# Patient Record
Sex: Male | Born: 1960 | Race: White | Hispanic: No | Marital: Married | Smoking: Former smoker
Health system: Southern US, Community
[De-identification: ages and names within clinical notes are randomized; demographics above are authoritative.]

## PROBLEM LIST (undated history)

## (undated) DIAGNOSIS — I10 Essential (primary) hypertension: Secondary | ICD-10-CM

## (undated) DIAGNOSIS — B159 Hepatitis A without hepatic coma: Secondary | ICD-10-CM

## (undated) DIAGNOSIS — K5792 Diverticulitis of intestine, part unspecified, without perforation or abscess without bleeding: Secondary | ICD-10-CM

## (undated) HISTORY — DX: Hepatitis a without hepatic coma: B15.9

## (undated) HISTORY — PX: TONSILLECTOMY: SUR1361

## (undated) HISTORY — DX: Diverticulitis of intestine, part unspecified, without perforation or abscess without bleeding: K57.92

---

## 1982-10-21 DIAGNOSIS — B159 Hepatitis A without hepatic coma: Secondary | ICD-10-CM

## 1982-10-21 HISTORY — DX: Hepatitis a without hepatic coma: B15.9

## 2006-02-12 ENCOUNTER — Emergency Department: Payer: Self-pay | Admitting: Emergency Medicine

## 2007-01-20 ENCOUNTER — Ambulatory Visit: Payer: Self-pay | Admitting: Nurse Practitioner

## 2011-10-22 DIAGNOSIS — K5792 Diverticulitis of intestine, part unspecified, without perforation or abscess without bleeding: Secondary | ICD-10-CM

## 2011-10-22 HISTORY — DX: Diverticulitis of intestine, part unspecified, without perforation or abscess without bleeding: K57.92

## 2013-04-20 HISTORY — PX: COLONOSCOPY: SHX174

## 2013-05-07 ENCOUNTER — Ambulatory Visit: Payer: Self-pay | Admitting: Gastroenterology

## 2013-08-28 ENCOUNTER — Encounter (HOSPITAL_COMMUNITY): Payer: Self-pay | Admitting: Emergency Medicine

## 2013-08-28 ENCOUNTER — Emergency Department (HOSPITAL_COMMUNITY)
Admission: EM | Admit: 2013-08-28 | Discharge: 2013-08-28 | Disposition: A | Discharge status: Home / Self Care | Payer: BC Managed Care – PPO | Attending: Emergency Medicine | Admitting: Emergency Medicine

## 2013-08-28 DIAGNOSIS — I10 Essential (primary) hypertension: Secondary | ICD-10-CM | POA: Insufficient documentation

## 2013-08-28 DIAGNOSIS — S61412A Laceration without foreign body of left hand, initial encounter: Secondary | ICD-10-CM

## 2013-08-28 DIAGNOSIS — Z87891 Personal history of nicotine dependence: Secondary | ICD-10-CM | POA: Insufficient documentation

## 2013-08-28 DIAGNOSIS — Y9389 Activity, other specified: Secondary | ICD-10-CM | POA: Insufficient documentation

## 2013-08-28 DIAGNOSIS — W298XXA Contact with other powered powered hand tools and household machinery, initial encounter: Secondary | ICD-10-CM | POA: Insufficient documentation

## 2013-08-28 DIAGNOSIS — Y929 Unspecified place or not applicable: Secondary | ICD-10-CM | POA: Insufficient documentation

## 2013-08-28 DIAGNOSIS — Z23 Encounter for immunization: Secondary | ICD-10-CM | POA: Insufficient documentation

## 2013-08-28 DIAGNOSIS — S61409A Unspecified open wound of unspecified hand, initial encounter: Secondary | ICD-10-CM | POA: Insufficient documentation

## 2013-08-28 HISTORY — DX: Essential (primary) hypertension: I10

## 2013-08-28 MED ORDER — TETANUS-DIPHTH-ACELL PERTUSSIS 5-2.5-18.5 LF-MCG/0.5 IM SUSP
0.5000 mL | Freq: Once | INTRAMUSCULAR | Status: AC
  Administered 2013-08-28: 0.5 mL via INTRAMUSCULAR
  Filled 2013-08-28: qty 0.5

## 2013-08-28 MED ORDER — LIDOCAINE HCL (PF) 1 % IJ SOLN
5.0000 mL | Freq: Once | INTRAMUSCULAR | Status: AC
  Administered 2013-08-28: 5 mL
  Filled 2013-08-28: qty 5

## 2013-08-28 MED ORDER — BACITRACIN-NEOMYCIN-POLYMYXIN 400-5-5000 EX OINT
TOPICAL_OINTMENT | Freq: Once | CUTANEOUS | Status: AC
  Administered 2013-08-28: 1 via TOPICAL
  Filled 2013-08-28: qty 1

## 2013-08-28 MED ORDER — CEPHALEXIN 500 MG PO CAPS
500.0000 mg | ORAL_CAPSULE | Freq: Once | ORAL | Status: AC
  Administered 2013-08-28: 500 mg via ORAL
  Filled 2013-08-28: qty 1

## 2013-08-28 MED ORDER — CEPHALEXIN 250 MG PO CAPS: 250.0000 mg | ORAL_CAPSULE | Freq: Four times a day (QID) | ORAL | Status: DC

## 2013-08-28 NOTE — ED Provider Notes (Signed)
CSN: [id number]     Arrival date & time 08/28/13  1646 History   First MD Initiated Contact with Patient 08/28/13 1732     Chief Complaint  Patient presents with  . Extremity Laceration   (Consider location/radiation/quality/duration/timing/severity/associated sxs/prior Treatment) Patient is a 52 y.o. male presenting with skin laceration. The history is provided by the patient.  Laceration Location:  Hand Hand laceration location:  L palm Depth:  Through dermis Quality: straight   Bleeding: controlled   Time since incident:  1 hour Pain details:    Quality:  Aching   Severity:  Moderate   Timing:  Constant   Progression:  Unchanged Foreign body present:  No foreign bodies Relieved by:  None tried Worsened by:  Movement Ineffective treatments:  None tried Tetanus status:  Unknown  Vincent Sanchez is a 52 y.o. male who presents to the ED with a laceration to the left hand. He was using a drill and it slipped and cut him.  Past Medical History  Diagnosis Date  . Hypertension    History reviewed. No pertinent past surgical history. No family history on file. History  Substance Use Topics  . Smoking status: Former Games developer  . Smokeless tobacco: Not on file  . Alcohol Use: No    Review of Systems Negative except as stated in HPI Allergies  Review of patient's allergies indicates no known allergies.  Home Medications  No current outpatient prescriptions on file. BP 137/61  Pulse 64  Temp(Src) 98.4 F (36.9 C) (Oral)  Resp 16  SpO2 100% Physical Exam  Nursing note and vitals reviewed. Constitutional: He is oriented to person, place, and time. He appears well-developed and well-nourished. No distress.  HENT:  Head: Normocephalic and atraumatic.  Eyes: Conjunctivae and EOM are normal.  Neck: Neck supple.  Pulmonary/Chest: Effort normal.  Abdominal: Soft. There is no tenderness.  Musculoskeletal:       Left hand: He exhibits laceration. He exhibits normal range of  motion, no bony tenderness, no deformity and no swelling. Normal sensation noted. Normal strength noted.       Hands: Neurological: He is alert and oriented to person, place, and time. No cranial nerve deficit.  Skin: Skin is warm and dry.  Psychiatric: He has a normal mood and affect. His behavior is normal.    ED Course  Procedures  LACERATION REPAIR Performed by: Trystin Hargrove Authorized by: Daryn Pisani Consent: Verbal consent obtained. Risks and benefits: risks, benefits and alternatives were discussed Consent given by: patient Patient identity confirmed: provided demographic data Prepped and Draped in normal sterile fashion Wound explored  Laceration Location: left hand  Laceration Length: 2 cm  No Foreign Bodies seen or palpated  Cleaned with betadine  Anesthesia: local infiltration  Local anesthetic: lidocaine 1% without epinephrine  Anesthetic total: 1 ml  Irrigation method: syringe Amount of cleaning: standard  Skin closure: 5-0 prolene  Number of sutures: 2  Technique: interrupted  Patient tolerance: Patient tolerated the procedure well with no immediate complications.  MDM  52 y.o. male with laceration to the right hand while using a drill. Neurovascularly intact and stable for discharge without any immediate complications. Tetanus updated. Bacitracin ointment and dressing applied. Follow up with PCP or here for suture removal in 7 days. He will return sooner for any problems.    Medication List         cephALEXin 250 MG capsule  Commonly known as:  KEFLEX  Take 1 capsule (250 mg total) by mouth 4 (four)  times daily.           [facility] Orlene Och, [place] 08/29/13 [phone]

## 2013-08-28 NOTE — ED Notes (Signed)
Laceration to base of left thumb, injured by a drill bit. Bleeding is controlled. Occurred 1 hour PTA. Unknown tetanus

## 2013-09-01 NOTE — ED Provider Notes (Signed)
Medical screening examination/treatment/procedure(s) were performed by non-physician practitioner and as supervising physician I was immediately available for consultation/collaboration.  EKG Interpretation   None         Shelda Jakes, MD 09/01/13 [phone]

## 2014-07-26 ENCOUNTER — Ambulatory Visit (INDEPENDENT_AMBULATORY_CARE_PROVIDER_SITE_OTHER): Payer: BC Managed Care – PPO | Admitting: General Surgery

## 2014-07-26 ENCOUNTER — Encounter: Payer: Self-pay | Admitting: General Surgery

## 2014-07-26 VITALS — BP 144/80 | HR 58 | Resp 12 | Ht 74.5 in | Wt 305.0 lb

## 2014-07-26 DIAGNOSIS — K8010 Calculus of gallbladder with chronic cholecystitis without obstruction: Secondary | ICD-10-CM

## 2014-07-26 NOTE — Progress Notes (Addendum)
Patient ID: Vincent Sanchez, male   DOB: Apr 30, 1961, 53 y.o.   MRN: [id number]  Chief Complaint  Patient presents with  . Abdominal Pain    gall bladder    HPI Vincent Sanchez is a 53 y.o. male.  Here today for evaluation of his gallbladder. Abdominal ultrasound completed 07-08-14. The pain is under his right rib area off and on for about a month. He states no pain this week but prior to that the pain was daily lasting "all day" but some days only 15 minutes. On Saturday one week ago the pain was all day. He does not think it is associated with any foods. No nausea or vomiting. Bowels ar normal and daily. He will have a sudden onset of a itching rash/whelps, abdomen, legs and arms. It comes and goes but not in relationship to the pain, it just occurs at random.   He has lost 100 pounds over a 2 year time time frame by modifying his diet and beginning an exercise program.  The patient is accompanied today by his wife who was present for the interview and exam.  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   HPI  Past Medical History  Diagnosis Date  . Hypertension   . Hepatitis A 1984  . Diverticulitis 2013    Past Surgical History  Procedure Laterality Date  . Tonsillectomy      age 74  . Colonoscopy  July 2014    Dr Servando Snare    Family History  Problem Relation Age of Onset  . Diabetes Father     Social History History  Substance Use Topics  . Smoking status: Former Smoker -- 14 years    Quit date: 10/21/1986  . Smokeless tobacco: Never Used  . Alcohol Use: No    No Known Allergies  Current Outpatient Prescriptions  Medication Sig Dispense Refill  . benazepril-hydrochlorthiazide (LOTENSIN HCT) 20-12.5 MG per tablet Take 1 tablet by mouth daily.       . furosemide (LASIX) 40 MG tablet Take by mouth daily.        No current facility-administered medications for this visit.    Review of Systems Review of Systems  Constitutional: Negative.   Cardiovascular: Negative.    Gastrointestinal: Positive for constipation. Negative for nausea, vomiting, diarrhea and blood in stool.    Blood pressure 144/80, pulse 58, resp. rate 12, height 6' 2.5" (1.892 m), weight 305 lb (138.347 kg).  Physical Exam Physical Exam  Constitutional: He is oriented to person, place, and time. He appears well-developed and well-nourished.  Neck: Neck supple.  Cardiovascular: Normal rate, regular rhythm and normal heart sounds.   Pulses:      Dorsalis pedis pulses are 2+ on the right side, and 2+ on the left side.  Pulmonary/Chest: Effort normal and breath sounds normal.  Abdominal: Soft. Normal appearance and bowel sounds are normal. There is tenderness in the right upper quadrant.  Lymphadenopathy:    He has no cervical adenopathy.  Neurological: He is alert and oriented to person, place, and time.  Skin: Skin is warm and dry.    Data Reviewed Abdominal ultrasound completed at [facility] in [place] showed cholelithiasis. Common bile duct 5 mm. No gallbladder wall thickening.  Assessment    Symptomatic cholelithiasis.    Plan    Indications for cholecystectomy were reviewed. 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    Laparoscopic Cholecystectomy with Intraoperative Cholangiogram. The procedure, including  it's potential risks and complications (including but not limited to infection, bleeding, injury to intra-abdominal organs or bile ducts, bile leak, poor cosmetic result, sepsis and death) were discussed with the patient in detail. Non-operative options, including their inherent risks (acute calculous cholecystitis with possible choledocholithiasis or gallstone pancreatitis, with the risk of ascending cholangitis, sepsis, and death) were discussed as well. The patient expressed and understanding of what we discussed and wishes to proceed with laparoscopic cholecystectomy. The patient further understands that if it is technically  not possible, or it is unsafe to proceed laparoscopically, that I will convert to an open cholecystectomy.  Patient is scheduled for [facility] at [facility] on 08/08/14. He will pre admit by phone. He is aware of date and instructions.  A prescription for Percocet 5/325, #30 with the inscription 1-2 by mouth every 4 hours when necessary for pain with no refills was provided.   PCP/Ref: Clovis PuVan Horn, Mancel ParsonsJill E   Nevan Creighton W 07/27/2014, 8:33 PM

## 2014-07-26 NOTE — Patient Instructions (Addendum)

## 2014-07-27 ENCOUNTER — Other Ambulatory Visit: Payer: Self-pay | Admitting: General Surgery

## 2014-07-27 DIAGNOSIS — K8010 Calculus of gallbladder with chronic cholecystitis without obstruction: Secondary | ICD-10-CM | POA: Insufficient documentation

## 2014-08-08 ENCOUNTER — Ambulatory Visit: Payer: Self-pay | Admitting: General Surgery

## 2014-08-08 ENCOUNTER — Encounter: Payer: Self-pay | Admitting: General Surgery

## 2014-08-08 DIAGNOSIS — K8010 Calculus of gallbladder with chronic cholecystitis without obstruction: Secondary | ICD-10-CM

## 2014-08-08 HISTORY — PX: CHOLECYSTECTOMY: SHX55

## 2014-08-10 ENCOUNTER — Encounter: Payer: Self-pay | Admitting: General Surgery

## 2014-08-10 LAB — PATHOLOGY REPORT

## 2014-08-17 ENCOUNTER — Ambulatory Visit: Payer: BC Managed Care – PPO | Admitting: General Surgery

## 2014-08-22 ENCOUNTER — Ambulatory Visit (INDEPENDENT_AMBULATORY_CARE_PROVIDER_SITE_OTHER): Payer: Self-pay | Admitting: General Surgery

## 2014-08-22 ENCOUNTER — Encounter: Payer: Self-pay | Admitting: General Surgery

## 2014-08-22 VITALS — BP 130/78 | HR 76 | Resp 12 | Ht 75.0 in | Wt 309.0 lb

## 2014-08-22 DIAGNOSIS — K8010 Calculus of gallbladder with chronic cholecystitis without obstruction: Secondary | ICD-10-CM

## 2014-08-22 NOTE — Patient Instructions (Signed)
Patient to return to work on 08/23/14. Return as needed.

## 2014-08-22 NOTE — Progress Notes (Signed)
Patient ID: Vincent Sanchez, male   DOB: [birth year], 53 y.o.   MRN: [id number]  Chief Complaint  Patient presents with  . Routine Post Op    gallbladder    HPI Vincent Sanchez is a 53 y.o. male here today for his post op gallbladder surgery done on 08/08/2014. Patient states he is doing well.Patient states he had a rash but it is gone now.  HPI  Past Medical History  Diagnosis Date  . Hypertension   . Hepatitis A 1984  . Diverticulitis 2013    Past Surgical History  Procedure Laterality Date  . Tonsillectomy      age 53  . Colonoscopy  July 2014    Dr Servando SnareWohl  . Cholecystectomy  08/08/14    Family History  Problem Relation Age of Onset  . Diabetes Father     Social History History  Substance Use Topics  . Smoking status: Former Smoker -- 14 years    Quit date: 10/21/1986  . Smokeless tobacco: Never Used  . Alcohol Use: No    No Known Allergies  Current Outpatient Prescriptions  Medication Sig Dispense Refill  . benazepril-hydrochlorthiazide (LOTENSIN HCT) 20-12.5 MG per tablet Take 1 tablet by mouth daily.     . furosemide (LASIX) 40 MG tablet Take by mouth daily.      No current facility-administered medications for this visit.    Review of Systems Review of Systems  Constitutional: Negative.   Respiratory: Negative.   Cardiovascular: Negative.     Blood pressure 130/78, pulse 76, resp. rate 12, height 6\' 3"  (1.905 m), weight 309 lb (140.161 kg).  Physical Exam Physical Exam  Constitutional: He is oriented to person, place, and time. He appears well-developed and well-nourished.  Abdominal:  Port site are clean and healing well  Neurological: He is alert and oriented to person, place, and time.  Skin: Skin is warm and dry.    Data Reviewed Chronic cholecystitis/ cholelithiasis. No malignancy.   Assessment    Doing well post cholecystectomy.     Plan    Patient to return as needed.  Return to work today.     PCP/Ref: Clovis PuVan Horn, Mancel ParsonsJill  E   Byrnett, Jeffrey W 08/23/2014, 9:11 PM

## 2015-02-11 NOTE — Op Note (Signed)
PATIENT NAME:  Vincent Sanchez, Eri L MR#:  [id number] DATE OF BIRTH:  08-28-1961  DATE OF PROCEDURE:  08/08/2014  PREOPERATIVE DIAGNOSIS: Chronic cholecystitis and cholelithiasis.   POSTOPERATIVE DIAGNOSIS: Chronic cholecystitis and cholelithiasis.   OPERATIVE PROCEDURE: Laparoscopic cholecystectomy with intraoperative cholangiograms.   SURGEON: Earline MayotteJeffrey W. Radhika Dershem, MD  ANESTHESIA: General endotracheal under Naomie DeanWilliam K. Kephart, MD   ESTIMATED BLOOD LOSS: Less than 5 mL.   CLINICAL NOTE: This [age] year old male has lost nearly 100 pounds with diet and exercise and has subsequently developed cholelithiasis. He is admitted for elective cholecystectomy.   OPERATIVE NOTE: With the patient under adequate general endotracheal anesthesia, the abdomen was prepped with ChloraPrep and draped. In Trendelenburg position, a Veress needle was placed through a transumbilical incision. After assuring intra-abdominal location with the hanging drop test, the abdomen was insufflated with CO2 at 10 mmHg pressure. A 10 mm step port was expanded and inspection showed no evidence of injury from initial port placement. An 11 mm XL port was placed in the epigastrium after the patient was placed in reverse Trendelenburg position and rolled to the left. Two 5 mm step ports were placed in the right lateral abdominal wall. The gallbladder showed evidence of mild chronic inflammation. There were adhesions between the omentum and the falciform ligament as well as the neck of the gallbladder. These were taken down with cautery dissection. With the gallbladder in cephalad traction, the cystic duct was clear.  Fluoroscopic cholangiograms were completed using 37 mL of one-half strength Conray 60. There was prompt filling of the right and left hepatic ducts and free flow into a nondistended distal common bile duct and into the duodenum. What appeared to be multiple small air bubbles were identified and these eventually cleared during  fluoroscopic examination. There was some reflux into the pancreatic duct. The cystic duct and branches of the cystic artery were doubly clipped and divided. The gallbladder was then removed from the liver bed making use of hook cautery dissection. It was delivered through the umbilical port site. Inspection from the epigastric site showed no evidence of injury from initial port placement. The right upper quadrant was irrigated with lactated Ringer's solution. Excellent hemostasis was noted. The abdomen was then desufflated and ports removed under direct vision. The fascia at the umbilicus was closed with an 0 Vicryl figure-of-eight suture. Skin incisions were closed with 4-0 Vicryl subcuticular sutures. Benzoin, Steri-Strips, Telfa and Tegaderm dressings were then applied. The patient tolerated the procedure well and was taken to the recovery room in stable condition.    ____________________________ Earline MayotteJeffrey W. Madelina Sanda, MD jwb:LT D: 08/08/2014 14:29:00 ET T: 08/08/2014 16:37:07 ET JOB#: [id number]  cc: Earline MayotteJeffrey W. Fredda Clarida, MD, <Dictator> [facility] Bucio Brion AlimentW Latravis Grine MD ELECTRONICALLY SIGNED 08/10/2014 21:32

## 2017-04-17 ENCOUNTER — Inpatient Hospital Stay: Payer: BC Managed Care – PPO | Attending: Oncology | Admitting: Oncology

## 2017-04-17 ENCOUNTER — Inpatient Hospital Stay: Payer: BC Managed Care – PPO

## 2017-04-17 ENCOUNTER — Encounter (INDEPENDENT_AMBULATORY_CARE_PROVIDER_SITE_OTHER): Payer: Self-pay

## 2017-04-17 ENCOUNTER — Encounter: Payer: Self-pay | Admitting: Oncology

## 2017-04-17 VITALS — BP 124/83 | HR 74 | Temp 98.0°F | Resp 18 | Ht 74.0 in | Wt 341.5 lb

## 2017-04-17 DIAGNOSIS — Z79899 Other long term (current) drug therapy: Secondary | ICD-10-CM | POA: Diagnosis not present

## 2017-04-17 DIAGNOSIS — E785 Hyperlipidemia, unspecified: Secondary | ICD-10-CM | POA: Diagnosis not present

## 2017-04-17 DIAGNOSIS — Z87891 Personal history of nicotine dependence: Secondary | ICD-10-CM | POA: Insufficient documentation

## 2017-04-17 DIAGNOSIS — K8010 Calculus of gallbladder with chronic cholecystitis without obstruction: Secondary | ICD-10-CM | POA: Diagnosis not present

## 2017-04-17 DIAGNOSIS — I10 Essential (primary) hypertension: Secondary | ICD-10-CM | POA: Insufficient documentation

## 2017-04-17 DIAGNOSIS — E119 Type 2 diabetes mellitus without complications: Secondary | ICD-10-CM | POA: Diagnosis not present

## 2017-04-17 DIAGNOSIS — M1990 Unspecified osteoarthritis, unspecified site: Secondary | ICD-10-CM | POA: Diagnosis not present

## 2017-04-17 DIAGNOSIS — D72829 Elevated white blood cell count, unspecified: Secondary | ICD-10-CM

## 2017-04-17 DIAGNOSIS — D72821 Monocytosis (symptomatic): Secondary | ICD-10-CM | POA: Insufficient documentation

## 2017-04-17 LAB — CBC WITH DIFFERENTIAL/PLATELET
Basophils Absolute: 0.1 10*3/uL (ref 0–0.1)
Basophils Relative: 1 %
Eosinophils Absolute: 0.2 10*3/uL (ref 0–0.7)
Eosinophils Relative: 1 %
HCT: 45.6 % (ref 40.0–52.0)
HEMOGLOBIN: 15.9 g/dL (ref 13.0–18.0)
LYMPHS ABS: 2.3 10*3/uL (ref 1.0–3.6)
Lymphocytes Relative: 18 %
MCH: 30.2 pg (ref 26.0–34.0)
MCHC: 34.9 g/dL (ref 32.0–36.0)
MCV: 86.7 fL (ref 80.0–100.0)
Monocytes Absolute: 1.1 10*3/uL — ABNORMAL HIGH (ref 0.2–1.0)
Monocytes Relative: 8 %
NEUTROS PCT: 72 %
Neutro Abs: 9.2 10*3/uL — ABNORMAL HIGH (ref 1.4–6.5)
Platelets: 358 10*3/uL (ref 150–440)
RBC: 5.26 MIL/uL (ref 4.40–5.90)
RDW: 14 % (ref 11.5–14.5)
WBC: 12.9 10*3/uL — AB (ref 3.8–10.6)

## 2017-04-17 LAB — SEDIMENTATION RATE: Sed Rate: 7 mm/hr (ref 0–20)

## 2017-04-17 LAB — PATHOLOGIST SMEAR REVIEW

## 2017-04-17 NOTE — Progress Notes (Signed)
Hematology/Oncology Consult note [facility] [phone] [phone]  Patient Care Team: Renee Rival, NP as PCP - General (Nurse Practitioner) Robert Bellow, MD (General Surgery) Velta Addison, Claretha Cooper, DO (Osteopathic Medicine)   Name of the patient: Vincent Sanchez  [id number]  11/06/1960    Reason for referral- leucocytosis   Referring physician- Angelina Ok  Date of visit: 04/17/17   History of presenting illness- patient is a 56 year old male with a past medical history significant for hypertension, diet-controlled diabetes, hyperlipidemia and arthritis. He has been referred to a leukocytosis. Recent CBC from 01/30/2017 showed white count of 12.3, H&H of 13/45.1 and platelet count of 335. Differential mainly showed neutrophilia with an absolute neutrophil count of 9.0 and monocytosis with an absolute monocyte count of 1.1. Prior CBC from 08/02/2016 showed white count of 12.5-10 with predominantly neutrophilia and WBC from 01/30/2016 showed white count of 10.3. Patient reports mild fatigue. Denies any unintentional weight loss or loss of appetite. Denies any drenching night sweats or lumps or bumps anywhere. He does have itching mainly in his bilateral forearms and his abdomen especially during fall season from allergies which is relatively well controlled with Zyrtec. Also reports pain in his bilateral ankles and stiffness in his small joints of the hands for which he uses glucosamine  ECOG PS- 1  Pain scale- 0   Review of systems- Review of Systems  Constitutional: Negative for chills, fever, malaise/fatigue and weight loss.  HENT: Negative for congestion, ear discharge and nosebleeds.   Eyes: Negative for blurred vision.  Respiratory: Negative for cough, hemoptysis, sputum production, shortness of breath and wheezing.   Cardiovascular: Negative for chest pain, palpitations, orthopnea and claudication.  Gastrointestinal:  Negative for abdominal pain, blood in stool, constipation, diarrhea, heartburn, melena, nausea and vomiting.  Genitourinary: Negative for dysuria, flank pain, frequency, hematuria and urgency.  Musculoskeletal: Positive for joint pain. Negative for back pain and myalgias.  Skin: Positive for itching. Negative for rash.  Neurological: Negative for dizziness, tingling, focal weakness, seizures, weakness and headaches.  Endo/Heme/Allergies: Does not bruise/bleed easily.  Psychiatric/Behavioral: Negative for depression and suicidal ideas. The patient does not have insomnia.     No Known Allergies  Patient Active Problem List   Diagnosis Date Noted  . Gallbladder stone with nonacute cholecystitis 07/27/2014     Past Medical History:  Diagnosis Date  . Diverticulitis 2013  . Hepatitis A 1984  . Hypertension      Past Surgical History:  Procedure Laterality Date  . CHOLECYSTECTOMY  08/08/14  . COLONOSCOPY  July 2014   Dr Allen Norris  . TONSILLECTOMY     age 84    Social History   Social History  . Marital status: Married    Spouse name: N/A  . Number of children: N/A  . Years of education: N/A   Occupational History  . Not on file.   Social History Main Topics  . Smoking status: Former Smoker    Years: 14.00    Quit date: 10/21/1986  . Smokeless tobacco: Never Used  . Alcohol use No  . Drug use: No  . Sexual activity: Not on file   Other Topics Concern  . Not on file   Social History Narrative  . No narrative on file     Family History  Problem Relation Age of Onset  . Diabetes Father   . Cancer Mother   . Cancer Maternal Grandfather      Current Outpatient Prescriptions:  .  benazepril-hydrochlorthiazide (LOTENSIN HCT) 20-12.5 MG per tablet, Take 1 tablet by mouth daily. , Disp: , Rfl:  .  furosemide (LASIX) 40 MG tablet, Take by mouth daily. , Disp: , Rfl:  .  Glucosamine-Chondroit-Vit C-Mn (GLUCOSAMINE 1500 COMPLEX) CAPS, Take 2 capsules by mouth daily.,  Disp: , Rfl:  .  loratadine (CLARITIN) 10 MG tablet, Take 10 mg by mouth daily., Disp: , Rfl:    Physical exam:  Vitals:   04/17/17 1338  BP: 124/83  Pulse: 74  Resp: 18  Temp: 98 F (36.7 C)  TempSrc: Tympanic  Weight: (!) 341 lb 8 oz (154.9 kg)  Height: _0  (1.88 m)   Physical Exam  Constitutional: He is oriented to person, place, and time.  Obese, in no acute distress  HENT:  Head: Normocephalic and atraumatic.  Eyes: EOM are normal. Pupils are equal, round, and reactive to light.  Neck: Normal range of motion.  Cardiovascular: Normal rate, regular rhythm and normal heart sounds.   Pulmonary/Chest: Effort normal and breath sounds normal.  Abdominal: Soft. Bowel sounds are normal.  Lymphadenopathy:  No palpable cervical, supraclavicular, axillary or inguinal adenopathy  Neurological: He is alert and oriented to person, place, and time.  Skin: Skin is warm and dry.        Assessment and plan- Patient is a 56 y.o. male referred for mild leucocytosis likely reactive  Leucocytosis mainly neutrophilia and monocytosis. It is mild wbc around 12 since October 2017. Not trending up. No other associated cytopenias, b symptoms or adenopathy. This is likely reactive due to allergies/ arthritis. Check cbc with diff, pathology review of smear, bcr abl testing and peripheral flow cytometry. I will call patient if results are abnormal. Otherwise I will see him back in 6 months with cbc/diff. No indication for bone marrow biopsy at this point  Thank you for this kind referral and the opportunity to participate in the care of this patient   Visit Diagnosis 1. Leukocytosis, unspecified type     Dr. Randa Evens, MD, MPH [facility] Pager- [phone] 04/17/2017  2:08 PM

## 2017-04-17 NOTE — Progress Notes (Signed)
Patient here today as new evaluation regarding leukocytosis.  Referred by Dr. Iran OuchStrader.

## 2017-04-21 LAB — COMP PANEL: LEUKEMIA/LYMPHOMA

## 2017-04-28 LAB — BCR-ABL1 FISH
CELLS ANALYZED: 200
Cells Counted: 200
PDF: 0

## 2017-10-23 ENCOUNTER — Telehealth: Payer: Self-pay | Admitting: Oncology

## 2017-10-23 ENCOUNTER — Inpatient Hospital Stay: Payer: BC Managed Care – PPO

## 2017-10-23 ENCOUNTER — Inpatient Hospital Stay: Payer: BC Managed Care – PPO | Admitting: Oncology

## 2017-10-23 NOTE — Telephone Encounter (Signed)
Called patient to reschedule missed appointments scheduled for 10/23/17. Spoke with patient regarding rescheduling missed appt and he stated that he does not wish to [facility] at [facility].  DO NOT RESCHEDULE, per patient request.  Dr Smith Robertao & Cordelia PenSherry notified via Staff msg.  notified.MF

## 2018-01-16 ENCOUNTER — Other Ambulatory Visit: Payer: Self-pay | Admitting: Internal Medicine

## 2018-01-16 DIAGNOSIS — I2089 Other forms of angina pectoris: Secondary | ICD-10-CM

## 2018-01-16 DIAGNOSIS — I208 Other forms of angina pectoris: Secondary | ICD-10-CM

## 2018-01-16 DIAGNOSIS — R0602 Shortness of breath: Secondary | ICD-10-CM

## 2018-01-16 DIAGNOSIS — I5030 Unspecified diastolic (congestive) heart failure: Secondary | ICD-10-CM

## 2018-01-22 ENCOUNTER — Ambulatory Visit
Admission: RE | Admit: 2018-01-22 | Discharge: 2018-01-22 | Disposition: A | Discharge status: Home / Self Care | Payer: BC Managed Care – PPO | Source: Ambulatory Visit | Attending: Internal Medicine | Admitting: Internal Medicine

## 2018-01-22 DIAGNOSIS — R0602 Shortness of breath: Secondary | ICD-10-CM

## 2018-01-22 DIAGNOSIS — I5030 Unspecified diastolic (congestive) heart failure: Secondary | ICD-10-CM | POA: Diagnosis not present

## 2018-01-22 DIAGNOSIS — I208 Other forms of angina pectoris: Secondary | ICD-10-CM | POA: Insufficient documentation

## 2018-01-22 DIAGNOSIS — I110 Hypertensive heart disease with heart failure: Secondary | ICD-10-CM | POA: Insufficient documentation

## 2018-01-22 DIAGNOSIS — I351 Nonrheumatic aortic (valve) insufficiency: Secondary | ICD-10-CM | POA: Diagnosis not present

## 2018-01-22 NOTE — Progress Notes (Signed)
*  PRELIMINARY RESULTS* Echocardiogram 2D Echocardiogram has been performed.  Vincent GulaJoan M Merari Sanchez 01/22/2018, 11:47 AM

## 2018-01-23 ENCOUNTER — Ambulatory Visit
Admission: RE | Admit: 2018-01-23 | Discharge: 2018-01-23 | Disposition: A | Discharge status: Home / Self Care | Payer: BC Managed Care – PPO | Source: Ambulatory Visit | Attending: Internal Medicine | Admitting: Internal Medicine

## 2018-01-23 MED ORDER — TECHNETIUM TC 99M TETROFOSMIN IV KIT
30.6500 | PACK | Freq: Once | INTRAVENOUS | Status: AC | PRN
  Administered 2018-01-23: 30.65 via INTRAVENOUS

## 2018-03-04 LAB — NM MYOCAR MULTI W/SPECT W/WALL MOTION / EF
CHL CUP NUCLEAR SRS: 2
CHL CUP RESTING HR STRESS: 99 {beats}/min
Estimated workload: 1 METS
Exercise duration (min): 1 min
Exercise duration (sec): 0 s
LV dias vol: 162 mL (ref 62–150)
LVSYSVOL: 102 mL
MPHR: 163 {beats}/min
NUC STRESS TID: 1.06
Peak HR: 148 {beats}/min
Percent HR: 90 %
SDS: 1
SSS: 1

## 2018-10-09 ENCOUNTER — Encounter: Payer: Self-pay | Admitting: Anesthesiology

## 2018-10-12 ENCOUNTER — Ambulatory Visit: Payer: BC Managed Care – PPO | Admitting: Anesthesiology

## 2018-10-12 ENCOUNTER — Encounter: Payer: Self-pay | Admitting: *Deleted

## 2018-10-12 ENCOUNTER — Ambulatory Visit
Admission: RE | Admit: 2018-10-12 | Discharge: 2018-10-12 | Disposition: A | Discharge status: Home / Self Care | Payer: BC Managed Care – PPO | Attending: Internal Medicine | Admitting: Internal Medicine

## 2018-10-12 ENCOUNTER — Other Ambulatory Visit: Payer: Self-pay

## 2018-10-12 ENCOUNTER — Encounter
Admission: RE | Disposition: A | Discharge status: Home / Self Care | Payer: Self-pay | Source: Home / Self Care | Attending: Internal Medicine

## 2018-10-12 DIAGNOSIS — Z6841 Body Mass Index (BMI) 40.0 and over, adult: Secondary | ICD-10-CM | POA: Insufficient documentation

## 2018-10-12 DIAGNOSIS — M1990 Unspecified osteoarthritis, unspecified site: Secondary | ICD-10-CM | POA: Insufficient documentation

## 2018-10-12 DIAGNOSIS — Z87891 Personal history of nicotine dependence: Secondary | ICD-10-CM | POA: Diagnosis not present

## 2018-10-12 DIAGNOSIS — I10 Essential (primary) hypertension: Secondary | ICD-10-CM | POA: Insufficient documentation

## 2018-10-12 DIAGNOSIS — Z79899 Other long term (current) drug therapy: Secondary | ICD-10-CM | POA: Insufficient documentation

## 2018-10-12 DIAGNOSIS — I4891 Unspecified atrial fibrillation: Secondary | ICD-10-CM | POA: Diagnosis present

## 2018-10-12 HISTORY — PX: CARDIOVERSION: EP1203

## 2018-10-12 SURGERY — CARDIOVERSION (CATH LAB)
Anesthesia: General

## 2018-10-12 MED ORDER — HYDROCORTISONE 1 % EX CREA
1.0000 "application " | TOPICAL_CREAM | Freq: Three times a day (TID) | CUTANEOUS | Status: DC | PRN
  Filled 2018-10-12: qty 28

## 2018-10-12 MED ORDER — PROPOFOL 10 MG/ML IV BOLUS
INTRAVENOUS | Status: DC | PRN
  Administered 2018-10-12 (×3): 20 mg via INTRAVENOUS
  Administered 2018-10-12: 50 mg via INTRAVENOUS

## 2018-10-12 MED ORDER — SODIUM CHLORIDE 0.9 % IV SOLN
INTRAVENOUS | Status: DC
  Administered 2018-10-12 (×2): via INTRAVENOUS

## 2018-10-12 MED ORDER — MIDAZOLAM HCL 2 MG/2ML IJ SOLN
INTRAMUSCULAR | Status: DC | PRN
  Administered 2018-10-12: 1 mg via INTRAVENOUS

## 2018-10-12 MED ORDER — MIDAZOLAM HCL 2 MG/2ML IJ SOLN
INTRAMUSCULAR | Status: AC
  Filled 2018-10-12: qty 2

## 2018-10-12 MED ORDER — LIDOCAINE HCL (CARDIAC) PF 100 MG/5ML IV SOSY
PREFILLED_SYRINGE | INTRAVENOUS | Status: DC | PRN
  Administered 2018-10-12: 80 mg via INTRAVENOUS

## 2018-10-12 MED ORDER — PROPOFOL 10 MG/ML IV BOLUS
INTRAVENOUS | Status: AC
  Filled 2018-10-12: qty 20

## 2018-10-12 NOTE — Anesthesia Preprocedure Evaluation (Addendum)
Anesthesia Evaluation  Patient identified by MRN, date of birth, ID band Patient awake    Reviewed: Allergy & Precautions, NPO status , Patient's Chart, lab work & pertinent test results, reviewed documented beta blocker date and time   Airway Mallampati: III  TM Distance: >3 FB     Dental  (+) Chipped   Pulmonary former smoker,           Cardiovascular hypertension,      Neuro/Psych    GI/Hepatic (+) Hepatitis -  Endo/Other  Morbid obesity  Renal/GU      Musculoskeletal  (+) Arthritis ,   Abdominal   Peds  Hematology   Anesthesia Other Findings EF 50-55 on 4/19.  Reproductive/Obstetrics                            Anesthesia Physical Anesthesia Plan  ASA: III  Anesthesia Plan: General   Post-op Pain Management:    Induction: Intravenous  PONV Risk Score and Plan:   Airway Management Planned:   Additional Equipment:   Intra-op Plan:   Post-operative Plan:   Informed Consent: I have reviewed the patients History and Physical, chart, labs and discussed the procedure including the risks, benefits and alternatives for the proposed anesthesia with the patient or authorized representative who has indicated his/her understanding and acceptance.     Plan Discussed with: CRNA  Anesthesia Plan Comments:         Anesthesia Quick Evaluation

## 2018-10-12 NOTE — Transfer of Care (Signed)
Immediate Anesthesia Transfer of Care Note  Patient: Vincent ShiversMichael Lawrence Kurtz  Procedure(s) Performed: CARDIOVERSION (N/A )  Patient Location: PACU  Anesthesia Type:General  Level of Consciousness: sedated  Airway & Oxygen Therapy: Patient Spontanous Breathing and Patient connected to nasal cannula oxygen  Post-op Assessment: Report given to RN and Post -op Vital signs reviewed and stable  Post vital signs: Reviewed and stable  Last Vitals:  Vitals Value Taken Time  BP 116/72 10/12/2018  7:43 AM  Temp    Pulse 69 10/12/2018  7:52 AM  Resp 17 10/12/2018  7:52 AM  SpO2 97 % 10/12/2018  7:52 AM    Last Pain:  Vitals:   10/12/18 0653  TempSrc: Oral  PainSc: 0-No pain         Complications: No apparent anesthesia complications

## 2018-10-12 NOTE — Discharge Instructions (Signed)
Chemical Cardioversion, Care After This sheet gives you information about how to care for yourself after your procedure. Your health care provider may also give you more specific instructions. If you have problems or questions, contact your health care provider. What can I expect after the procedure? After the procedure, it is common to have:  Fatigue or tiredness.  . Follow these instructions at home: General instructions   Take over-the-counter and prescription medicines only as told by your health care provider. You may need to take blood thinners (anticoagulants) or medicines to control your heart rhythm.  If you are taking blood thinners: ? Talk with your health care provider before you take any medicines that contain aspirin or NSAIDs. These medicines increase your risk for dangerous bleeding. ? Take your medicine exactly as told, at the same time every day. ? Avoid activities that could cause injury or bruising, and follow instructions about how to prevent falls. ? Wear a medical alert bracelet or carry a card that lists what medicines you take.  Keep all follow-up visits as told by your health care provider. This is important. Eating and drinking   Follow instructions from your health care provider about eating and drinking restrictions. You may have to follow a low-salt (low-sodium), low-fat, and low-cholesterol diet.  Drink enough fluid to keep your urine pale yellow. Activity  Ask your health care provider what activities are safe for you.  Do not drive for 24 hours if you were given a medicine to help you relax (sedative) during your procedure. Lifestyle  Limit alcohol intake to no more than 1 drink a day for nonpregnant women and 2 drinks a day for men. One drink equals 12 oz of beer, 5 oz of wine, or 1 oz of hard liquor.  Do not use any products that contain nicotine or tobacco, such as cigarettes and e-cigarettes. If you need help quitting, ask your health care  provider. Contact a health care provider if:  You have a fever.  You have severe pain, and medicines do not help.  You have problems taking your medicines.  You have irregular heartbeats. Get help right away if:   Your heart rhythm changes.  You have chest pain or shortness of breath.  You feel dizzy.  You faint.  You have any symptoms of a stroke. "BE FAST" is an easy way to remember the main warning signs of a stroke: ? B - Balance. Signs are dizziness, sudden trouble walking, or loss of balance. ? E - Eyes. Signs are trouble seeing or a sudden change in vision. ? F - Face. Signs are sudden weakness or numbness of the face, or the face or eyelid drooping on one side. ? A - Arms. Signs are weakness or numbness in an arm. This happens suddenly and usually on one side of the body. ? S - Speech. Signs are sudden trouble speaking, slurred speech, or trouble understanding what people say. ? T - Time. Time to call emergency services. Write down what time symptoms started.  You have other signs of a stroke, such as: ? A sudden, severe headache with no known cause. ? Nausea or vomiting. ? Seizure. These symptoms may represent a serious problem that is an emergency. Do not wait to see if the symptoms will go away. Get medical help right away. Call your local emergency services (911 in the [place] Do not drive yourself to the hospital. Summary  Some fatigue is common after this procedure.  You may have  to take blood thinners (anticoagulants) or medicines to control your heart rhythm.  If you have symptoms of a stroke, get help right away. "BE FAST" is an easy way to remember the main warning signs of a stroke. This information is not intended to replace advice given to you by your health care provider. Make sure you discuss any questions you have with your health care provider. Document Released: 03/28/2017 Document Revised: 06/26/2018 Document Reviewed: 03/28/2017 Elsevier  Interactive Patient Education  2019 [employer]

## 2018-10-12 NOTE — Anesthesia Postprocedure Evaluation (Signed)
Anesthesia Post Note  Patient: Vincent Sanchez  Procedure(s) Performed: CARDIOVERSION (N/A )  Patient location during evaluation: Cath Lab Anesthesia Type: General Level of consciousness: awake and alert Pain management: pain level controlled Vital Signs Assessment: post-procedure vital signs reviewed and stable Respiratory status: spontaneous breathing, nonlabored ventilation, respiratory function stable and patient connected to nasal cannula oxygen Cardiovascular status: blood pressure returned to baseline and stable Postop Assessment: no apparent nausea or vomiting Anesthetic complications: no     Last Vitals:  Vitals:   10/12/18 0825 10/12/18 0844  BP: 116/75 115/78  Pulse:  81  Resp: 14 16  Temp:    SpO2: 96%     Last Pain:  Vitals:   10/12/18 0844  TempSrc:   PainSc: 0-No pain                 [name]

## 2018-10-12 NOTE — Anesthesia Procedure Notes (Signed)
Date/Time: 10/12/2018 7:30 AM Performed by: Henrietta HooverPope, Jaxsin Bottomley, CRNA Pre-anesthesia Checklist: Patient identified, Emergency Drugs available, Suction available, Patient being monitored and Timeout performed Oxygen Delivery Method: Nasal cannula Placement Confirmation: positive ETCO2

## 2018-10-12 NOTE — CV Procedure (Signed)
Cardioversion for atrial fibrillation as an outpatient Patient was brought to outpatient specials recovery because of A. fib controlled rate Patient is been maintained on amiodarone 200 Eliquis 5 twice a day and Cardizem  Anesthesia sedation was performed by anesthesia Patient was attempted biphasic 120 J Patient did not convert so 200 J was attempted x4 without significant cardioversion so the procedure was aborted Patient was recovered by anesthesia  Conclusion Unsuccessful elective cardioversion

## 2018-10-12 NOTE — Anesthesia Post-op Follow-up Note (Signed)
Anesthesia QCDR form completed.        

## 2019-06-01 ENCOUNTER — Other Ambulatory Visit: Payer: Self-pay

## 2019-06-01 ENCOUNTER — Encounter (HOSPITAL_COMMUNITY): Payer: Self-pay | Admitting: Emergency Medicine

## 2019-06-01 ENCOUNTER — Emergency Department (HOSPITAL_COMMUNITY)
Admission: EM | Admit: 2019-06-01 | Discharge: 2019-06-01 | Disposition: A | Discharge status: Home / Self Care | Payer: BC Managed Care – PPO | Attending: Emergency Medicine | Admitting: Emergency Medicine

## 2019-06-01 ENCOUNTER — Emergency Department (HOSPITAL_COMMUNITY): Payer: BC Managed Care – PPO

## 2019-06-01 DIAGNOSIS — Y929 Unspecified place or not applicable: Secondary | ICD-10-CM | POA: Insufficient documentation

## 2019-06-01 DIAGNOSIS — S61412A Laceration without foreign body of left hand, initial encounter: Secondary | ICD-10-CM | POA: Insufficient documentation

## 2019-06-01 DIAGNOSIS — Y9389 Activity, other specified: Secondary | ICD-10-CM | POA: Insufficient documentation

## 2019-06-01 DIAGNOSIS — Z87891 Personal history of nicotine dependence: Secondary | ICD-10-CM | POA: Diagnosis not present

## 2019-06-01 DIAGNOSIS — Z7901 Long term (current) use of anticoagulants: Secondary | ICD-10-CM | POA: Insufficient documentation

## 2019-06-01 DIAGNOSIS — I10 Essential (primary) hypertension: Secondary | ICD-10-CM | POA: Diagnosis not present

## 2019-06-01 DIAGNOSIS — Y999 Unspecified external cause status: Secondary | ICD-10-CM | POA: Diagnosis not present

## 2019-06-01 DIAGNOSIS — Z79899 Other long term (current) drug therapy: Secondary | ICD-10-CM | POA: Insufficient documentation

## 2019-06-01 DIAGNOSIS — Z23 Encounter for immunization: Secondary | ICD-10-CM | POA: Insufficient documentation

## 2019-06-01 DIAGNOSIS — W298XXA Contact with other powered powered hand tools and household machinery, initial encounter: Secondary | ICD-10-CM | POA: Diagnosis not present

## 2019-06-01 DIAGNOSIS — S6992XA Unspecified injury of left wrist, hand and finger(s), initial encounter: Secondary | ICD-10-CM | POA: Diagnosis present

## 2019-06-01 MED ORDER — DOXYCYCLINE HYCLATE 100 MG PO CAPS: 100.0000 mg | ORAL_CAPSULE | Freq: Two times a day (BID) | ORAL | 0 refills | Status: DC

## 2019-06-01 MED ORDER — TETANUS-DIPHTH-ACELL PERTUSSIS 5-2.5-18.5 LF-MCG/0.5 IM SUSP
0.5000 mL | Freq: Once | INTRAMUSCULAR | Status: AC
  Administered 2019-06-01: 21:00:00 0.5 mL via INTRAMUSCULAR
  Filled 2019-06-01: qty 0.5

## 2019-06-01 MED ORDER — DOXYCYCLINE HYCLATE 100 MG PO TABS
100.0000 mg | ORAL_TABLET | Freq: Once | ORAL | Status: AC
  Administered 2019-06-01: 100 mg via ORAL
  Filled 2019-06-01: qty 1

## 2019-06-01 NOTE — ED Triage Notes (Signed)
Pt C/O laceration between the left middle and pointer fingers. Pt stating he was holding the "chuck key trying to get a drill bit out and the key cut in between my finger and went through the top of my hand."

## 2019-06-01 NOTE — Discharge Instructions (Addendum)
Your laceration was repaired with a dissolvable stitch.  These will come out within the next 7 to 10 days.  Please use the finger splint over the next 4 or 5 days.  Please be careful with movement of your fingers so as to not pop loose the sutures in between your fingers.  Please keep the wound clean and dry. Doxycycline 2 times daily with food. Please see your primary physician or return to the emergency department if any red streaks going up your arm, any pus like drainage from the suture area.  Fever that will not respond to Tylenol or ibuprofen, changes in your general condition, problems, or concerns.  Your tetanus status was updated tonight.  Please update your medical records, and please notify your physicians noted it may become a part of your office medical record.

## 2019-06-01 NOTE — ED Provider Notes (Signed)
[facility] EMERGENCY DEPARTMENT Provider Note   CSN: [id number] Arrival date & time: 06/01/19  1934     History   Chief Complaint Chief Complaint  Patient presents with   Laceration    HPI Vincent Sanchez is a 58 y.o. male.     Patient states he was working on his truck, was putting away tools.  He was taking to remove a drill bit.  He accidentally hit the trigger and sustained a laceration/puncture wound to the web area between the index finger and the middle finger on the left hand.  It is of note the patient is on Eliquis.  He wash the area out, and pulled the drill bit out of his hand.  He presents to the emergency department for evaluation of the laceration and treatment of the injury.  No other injury reported.  Patient reports the last tetanus was about 6 years ago.  The history is provided by the patient.  Laceration Associated symptoms: no rash     Past Medical History:  Diagnosis Date   Diverticulitis 2013   Hepatitis A 1984   Hypertension     Patient Active Problem List   Diagnosis Date Noted   Hypertension 04/17/2017   Hyperlipidemia 04/17/2017   Arthritis 04/17/2017   Gallbladder stone with nonacute cholecystitis 07/27/2014    Past Surgical History:  Procedure Laterality Date   CARDIOVERSION N/A 10/12/2018   Procedure: CARDIOVERSION;  Surgeon: Yolonda Kida, MD;  Location: [facility] ORS;  Service: Cardiovascular;  Laterality: N/A;   CHOLECYSTECTOMY  08/08/14   COLONOSCOPY  July 2014   Dr Allen Norris   TONSILLECTOMY     age 37        Home Medications    Prior to Admission medications   Medication Sig Start Date End Date Taking? Authorizing Provider  amiodarone (PACERONE) 200 MG tablet Take 200 mg by mouth daily.    [provider]  apixaban (ELIQUIS) 5 MG TABS tablet Take 5 mg by mouth 2 (two) times daily.    [provider]  benazepril-hydrochlorthiazide (LOTENSIN HCT) 20-12.5 MG per tablet Take 1 tablet by  mouth daily.  07/23/14   [provider]  cetirizine (ZYRTEC) 10 MG tablet Take 10 mg by mouth daily.    [provider]  diltiazem (CARDIZEM SR) 120 MG 12 hr capsule Take 120 mg by mouth 2 (two) times daily.    [provider]  furosemide (LASIX) 40 MG tablet Take 80 mg by mouth every morning.  07/23/14   [provider]  Glucosamine-Chondroit-Vit C-Mn (GLUCOSAMINE 1500 COMPLEX) CAPS Take 2 capsules by mouth daily.    [provider]    Family History Family History  Problem Relation Age of Onset   Diabetes Father    Cancer Mother    Cancer Maternal Grandfather     Social History Social History   Tobacco Use   Smoking status: Former Smoker    Years: 14.00    Quit date: 10/21/1986    Years since quitting: 32.6   Smokeless tobacco: Never Used  Substance Use Topics   Alcohol use: No   Drug use: No     Allergies   Patient has no known allergies.   Review of Systems Review of Systems  Constitutional: Negative for activity change and appetite change.  HENT: Negative for congestion, ear discharge, ear pain, facial swelling, nosebleeds, rhinorrhea, sneezing and tinnitus.   Eyes: Negative for photophobia, pain and discharge.  Respiratory: Negative for cough, choking, shortness  of breath and wheezing.   Cardiovascular: Negative for chest pain, palpitations and leg swelling.  Gastrointestinal: Negative for abdominal pain, blood in stool, constipation, diarrhea, nausea and vomiting.  Genitourinary: Negative for difficulty urinating, dysuria, flank pain, frequency and hematuria.  Musculoskeletal: Negative for back pain, gait problem, myalgias and neck pain.  Skin: Negative for color change, rash and wound.  Neurological: Negative for dizziness, seizures, syncope, facial asymmetry, speech difficulty, weakness and numbness.  Hematological: Negative for adenopathy. Does not bruise/bleed easily.  Psychiatric/Behavioral: Negative for  agitation, confusion, hallucinations, self-injury and suicidal ideas. The patient is not nervous/anxious.      Physical Exam Updated Vital Signs BP (!) 141/75 (BP Location: Right Arm)    Pulse 60    Temp 98.9 F (37.2 C) (Oral)    Resp 18    Ht 6\' 2"  (1.88 m)    Wt (!) 147.4 kg    SpO2 98%    BMI 41.73 kg/m   Physical Exam Vitals signs and nursing note reviewed.  Constitutional:      Appearance: He is well-developed. He is not toxic-appearing.  HENT:     Head: Normocephalic.     Right Ear: Tympanic membrane and external ear normal.     Left Ear: Tympanic membrane and external ear normal.  Eyes:     General: Lids are normal.     Pupils: Pupils are equal, round, and reactive to light.  Neck:     Musculoskeletal: Normal range of motion and neck supple.     Vascular: No carotid bruit.  Cardiovascular:     Rate and Rhythm: Normal rate and regular rhythm.     Pulses: Normal pulses.     Heart sounds: Normal heart sounds.  Pulmonary:     Effort: No respiratory distress.     Breath sounds: Normal breath sounds.  Abdominal:     General: Bowel sounds are normal.     Palpations: Abdomen is soft.     Tenderness: There is no abdominal tenderness. There is no guarding.  Musculoskeletal: Normal range of motion.     Left hand: He exhibits tenderness and laceration.       Hands:     Comments: No temp changes. Cap refill less than 2 sec.  Lymphadenopathy:     Head:     Right side of head: No submandibular adenopathy.     Left side of head: No submandibular adenopathy.     Cervical: No cervical adenopathy.  Skin:    General: Skin is warm and dry.  Neurological:     Mental Status: He is alert and oriented to person, place, and time.     Cranial Nerves: No cranial nerve deficit.     Sensory: No sensory deficit.     Comments: No motor or sensory deficit of the upper extremities.  Psychiatric:        Speech: Speech normal.      ED Treatments / Results  Labs (all labs ordered are  listed, but only abnormal results are displayed) Labs Reviewed - No data to display  EKG None  Radiology Dg Hand Complete Left  Result Date: 06/01/2019 CLINICAL DATA:  Laceration to the second and third digits EXAM: LEFT HAND - COMPLETE 3+ VIEW COMPARISON:  None. FINDINGS: No acute fracture is identified. Subcutaneous air is noted at the third MCP joint consistent with the recent injury. No radiopaque foreign body is noted. IMPRESSION: Soft tissue injury without acute bony abnormality. Electronically Signed   By: Loraine LericheMark  Lukens M.D.   On: 06/01/2019 20:02    Procedures .Marland Kitchen.Laceration Repair  Date/Time: 06/01/2019 9:24 PM Performed by: Ivery QualeBryant, Jerzie Bieri, PA-C Authorized by: Ivery QualeBryant, Garlon Tuggle, PA-C   Consent:    Consent obtained:  Verbal   Consent given by:  Patient   Risks discussed:  Infection, pain, poor wound healing and poor cosmetic result Universal protocol:    Procedure explained and questions answered to patient or proxy's satisfaction: yes     Immediately prior to procedure, a time out was called: yes     Patient identity confirmed:  Arm band Anesthesia (see MAR for exact dosages):    Anesthesia method:  Local infiltration   Local anesthetic:  Lidocaine 2% w/o epi Laceration details:    Location:  Hand   Hand location: web space between the index and middle finger on the left.   Length (cm):  1.6 Repair type:    Repair type:  Simple Pre-procedure details:    Preparation:  Patient was prepped and draped in usual sterile fashion and imaging obtained to evaluate for foreign bodies Exploration:    Hemostasis achieved with:  Direct pressure   Wound exploration: wound explored through full range of motion and entire depth of wound probed and visualized     Wound extent: no foreign bodies/material noted, no nerve damage noted, no tendon damage noted and no underlying fracture noted   Treatment:    Area cleansed with:  Betadine   Amount of cleaning:  Standard   Irrigation solution:   Sterile saline Skin repair:    Repair method:  Sutures   Suture size:  4-0   Suture technique: Vicryl-Rapide.   Number of sutures:  5 Approximation:    Approximation:  Close Post-procedure details:    Dressing:  Non-adherent dressing and splint for protection   Patient tolerance of procedure:  Tolerated well, no immediate complications   (including critical care time)  Medications Ordered in ED Medications - No data to display   Initial Impression / Assessment and Plan / ED Course  I have reviewed the triage vital signs and the nursing notes.  Pertinent labs & imaging results that were available during my care of the patient were reviewed by me and considered in my medical decision making (see chart for details).          Final Clinical Impressions(s) / ED Diagnoses MDM  Patient sustained a laceration in the webspace between the index finger and the middle finger.  There is also a exit puncture wound at the dorsum of the hand near the MP joint.  No neurovascular deficits appreciated on examination.  X-ray was obtained.  There is no air in the capsule.  No fracture, no dislocation, and no foreign body noted.  The wound was repaired with 5 interrupted sutures of Vicryl Rapide.   Tetanus status was updated.  The patient was advised that the sutures would dissolve on their own.  The patient was placed in a finger splint to protect the sutured area.  Patient is given instructions to return immediately if any signs of advancing infection, problems, or concerns.   Final diagnoses:  Laceration of left hand without foreign body, initial encounter    ED Discharge Orders    None       Ivery QualeBryant, Kaniya Trueheart, Cordelia Poche-C 06/01/19 2130    Raeford RazorKohut, Stephen, MD 06/03/19 [phone]

## 2021-01-10 IMAGING — CR LEFT HAND - COMPLETE 3+ VIEW
3 series · 3 of 3 positions shown · non-contrast
Comparison: None.

CLINICAL DATA: Laceration to the second and third digits

EXAM:
LEFT HAND - COMPLETE 3+ VIEW

[pa]
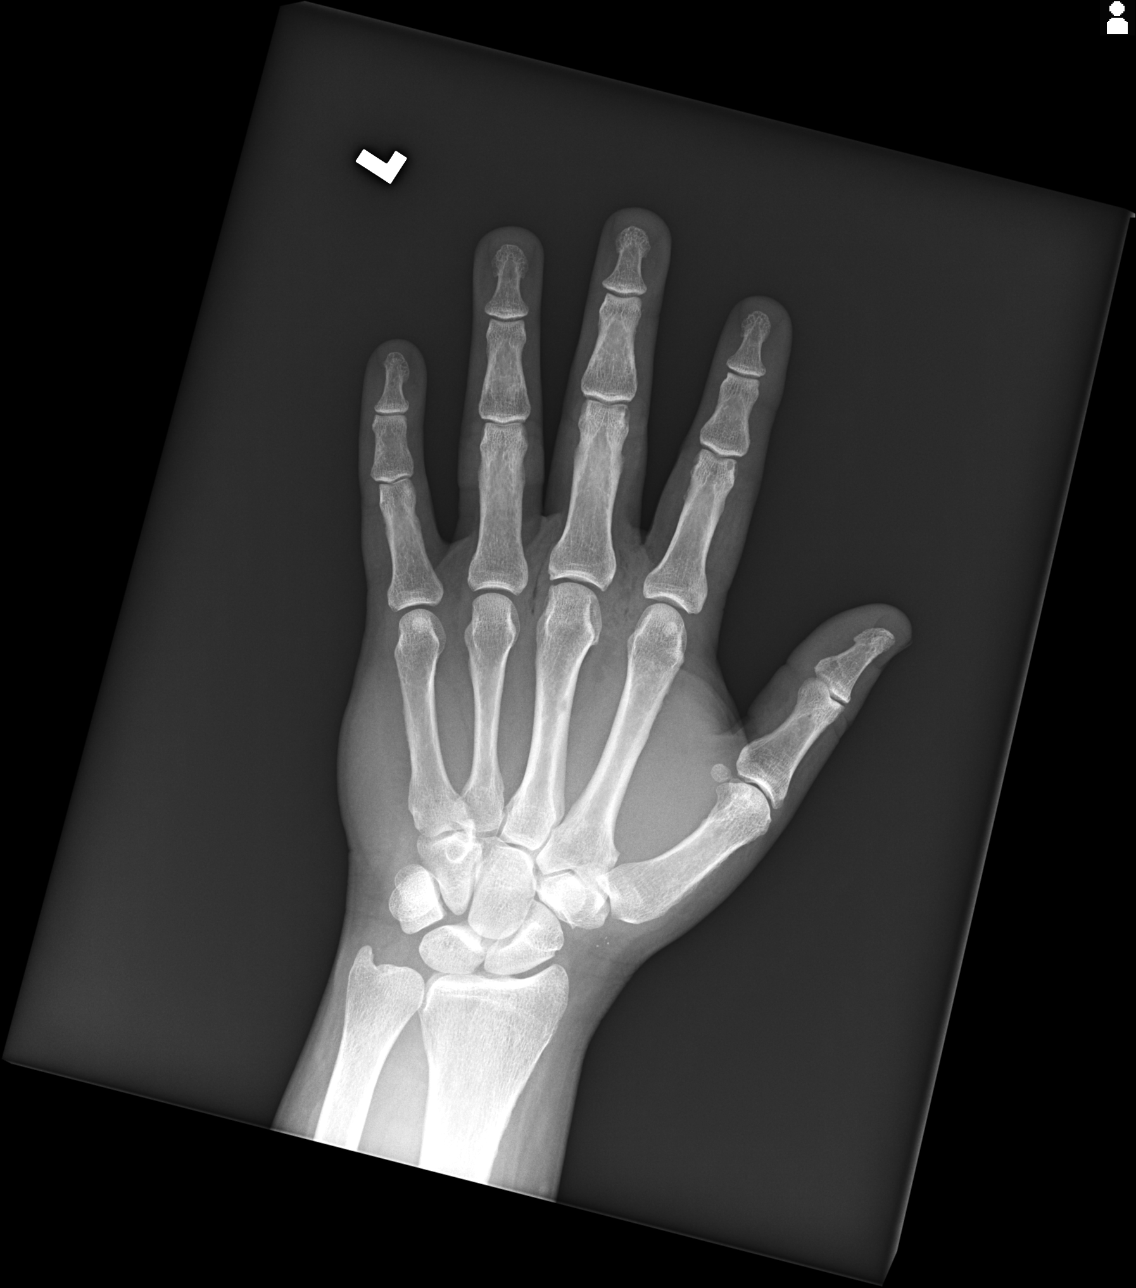

[oblique]
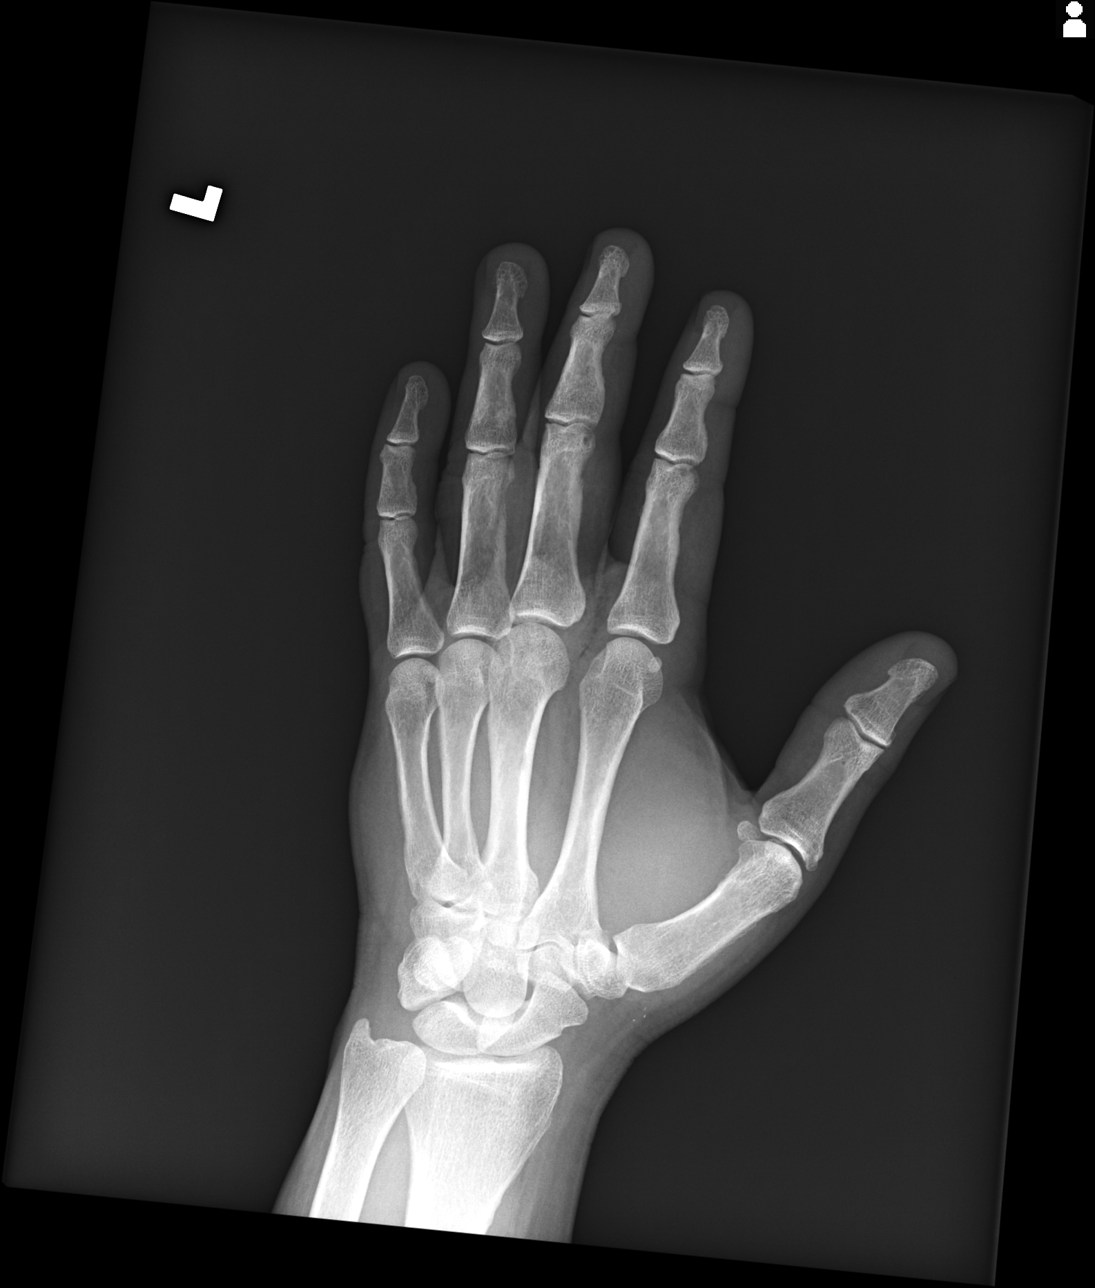

[lat]
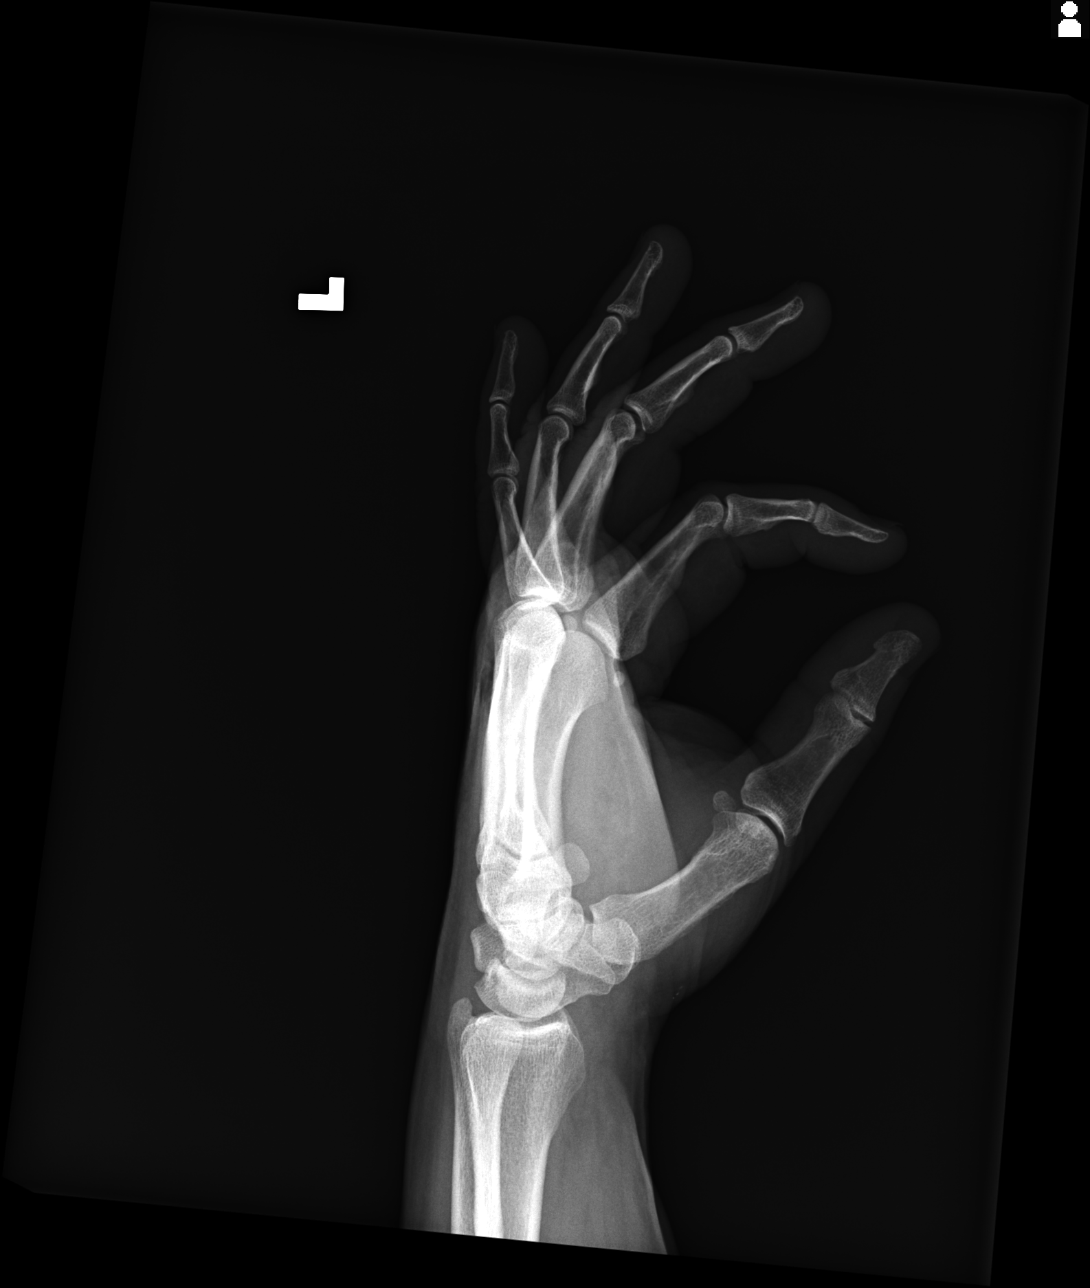

[3 of 3 positions shown; findings below may reference images not displayed]

FINDINGS: No acute fracture is identified. Subcutaneous air is noted at the
third MCP joint consistent with the recent injury. No radiopaque
foreign body is noted.
IMPRESSION: Soft tissue injury without acute bony abnormality.

## 2023-04-10 ENCOUNTER — Other Ambulatory Visit: Payer: Self-pay

## 2023-04-10 ENCOUNTER — Emergency Department: Payer: BC Managed Care – PPO

## 2023-04-10 ENCOUNTER — Observation Stay
Admission: EM | Admit: 2023-04-10 | Discharge: 2023-04-11 | Disposition: A | Discharge status: Home / Self Care | Payer: BC Managed Care – PPO | Attending: Internal Medicine | Admitting: Internal Medicine

## 2023-04-10 DIAGNOSIS — I480 Paroxysmal atrial fibrillation: Secondary | ICD-10-CM | POA: Diagnosis not present

## 2023-04-10 DIAGNOSIS — I4891 Unspecified atrial fibrillation: Secondary | ICD-10-CM | POA: Diagnosis not present

## 2023-04-10 DIAGNOSIS — Z79899 Other long term (current) drug therapy: Secondary | ICD-10-CM | POA: Diagnosis not present

## 2023-04-10 DIAGNOSIS — E7800 Pure hypercholesterolemia, unspecified: Secondary | ICD-10-CM | POA: Diagnosis not present

## 2023-04-10 DIAGNOSIS — I10 Essential (primary) hypertension: Secondary | ICD-10-CM | POA: Diagnosis not present

## 2023-04-10 DIAGNOSIS — R7989 Other specified abnormal findings of blood chemistry: Secondary | ICD-10-CM | POA: Diagnosis not present

## 2023-04-10 DIAGNOSIS — E669 Obesity, unspecified: Secondary | ICD-10-CM | POA: Diagnosis not present

## 2023-04-10 DIAGNOSIS — I5022 Chronic systolic (congestive) heart failure: Secondary | ICD-10-CM | POA: Insufficient documentation

## 2023-04-10 DIAGNOSIS — Z87891 Personal history of nicotine dependence: Secondary | ICD-10-CM | POA: Insufficient documentation

## 2023-04-10 DIAGNOSIS — R531 Weakness: Secondary | ICD-10-CM | POA: Diagnosis present

## 2023-04-10 DIAGNOSIS — Z7901 Long term (current) use of anticoagulants: Secondary | ICD-10-CM | POA: Diagnosis not present

## 2023-04-10 DIAGNOSIS — I110 Hypertensive heart disease with heart failure: Secondary | ICD-10-CM | POA: Diagnosis not present

## 2023-04-10 DIAGNOSIS — Z6839 Body mass index (BMI) 39.0-39.9, adult: Secondary | ICD-10-CM | POA: Diagnosis not present

## 2023-04-10 DIAGNOSIS — E785 Hyperlipidemia, unspecified: Secondary | ICD-10-CM | POA: Diagnosis present

## 2023-04-10 LAB — COMPREHENSIVE METABOLIC PANEL
ALT: 129 U/L — ABNORMAL HIGH (ref 0–44)
AST: 87 U/L — ABNORMAL HIGH (ref 15–41)
Albumin: 3.9 g/dL (ref 3.5–5.0)
Alkaline Phosphatase: 81 U/L (ref 38–126)
Anion gap: 5 (ref 5–15)
BUN: 17 mg/dL (ref 8–23)
CO2: 27 mmol/L (ref 22–32)
Calcium: 8.6 mg/dL — ABNORMAL LOW (ref 8.9–10.3)
Chloride: 108 mmol/L (ref 98–111)
Creatinine, Ser: 0.69 mg/dL (ref 0.61–1.24)
GFR, Estimated: >60 mL/min (ref >60)
Glucose, Bld: 96 mg/dL (ref 70–99)
Potassium: 3.9 mmol/L (ref 3.5–5.1)
Sodium: 140 mmol/L (ref 135–145)
Total Bilirubin: 1.2 mg/dL (ref 0.3–1.2)
Total Protein: 6.2 g/dL — ABNORMAL LOW (ref 6.5–8.1)

## 2023-04-10 LAB — CBC
HCT: 46.2 % (ref 39.0–52.0)
Hemoglobin: 15.2 g/dL (ref 13.0–17.0)
MCH: 32.3 pg (ref 26.0–34.0)
MCHC: 32.9 g/dL (ref 30.0–36.0)
MCV: 98.1 fL (ref 80.0–100.0)
Platelets: 221 10*3/uL (ref 150–400)
RBC: 4.71 MIL/uL (ref 4.22–5.81)
RDW: 12.4 % (ref 11.5–15.5)
WBC: 10.1 10*3/uL (ref 4.0–10.5)
nRBC: 0 % (ref 0.0–0.2)

## 2023-04-10 LAB — PROTIME-INR
INR: 1.3 — ABNORMAL HIGH (ref 0.8–1.2)
Prothrombin Time: 16.4 seconds — ABNORMAL HIGH (ref 11.4–15.2)

## 2023-04-10 LAB — TROPONIN I (HIGH SENSITIVITY)
Troponin I (High Sensitivity): 14 ng/L (ref <18)
Troponin I (High Sensitivity): 11 ng/L (ref <18)

## 2023-04-10 LAB — TSH: TSH: 2.941 u[IU]/mL (ref 0.350–4.500)

## 2023-04-10 LAB — BRAIN NATRIURETIC PEPTIDE: B Natriuretic Peptide: 257.8 pg/mL — ABNORMAL HIGH (ref 0.0–100.0)

## 2023-04-10 LAB — HEPARIN LEVEL (UNFRACTIONATED): Heparin Unfractionated: 0.11 IU/mL — ABNORMAL LOW (ref 0.30–0.70)

## 2023-04-10 LAB — MAGNESIUM: Magnesium: 2.1 mg/dL (ref 1.7–2.4)

## 2023-04-10 MED ORDER — AMIODARONE HCL IN DEXTROSE 360-4.14 MG/200ML-% IV SOLN: 30.0000 mg/h | INTRAVENOUS | Status: DC

## 2023-04-10 MED ORDER — AMIODARONE LOAD VIA INFUSION
150.0000 mg | Freq: Once | INTRAVENOUS | Status: AC
  Administered 2023-04-10: 150 mg via INTRAVENOUS
  Filled 2023-04-10: qty 83.34

## 2023-04-10 MED ORDER — AMIODARONE HCL IN DEXTROSE 360-4.14 MG/200ML-% IV SOLN
30.0000 mg/h | INTRAVENOUS | Status: DC
  Administered 2023-04-10 – 2023-04-11 (×2): 30 mg/h via INTRAVENOUS
  Filled 2023-04-10 (×2): qty 200

## 2023-04-10 MED ORDER — GLUCOSAMINE 1500 COMPLEX PO CAPS: 2.0000 | ORAL_CAPSULE | Freq: Every day | ORAL | Status: DC

## 2023-04-10 MED ORDER — FOLIC ACID 1 MG PO TABS
1.0000 mg | ORAL_TABLET | Freq: Every day | ORAL | Status: DC
  Administered 2023-04-11: 1 mg via ORAL
  Filled 2023-04-10: qty 1

## 2023-04-10 MED ORDER — HEPARIN BOLUS VIA INFUSION
2500.0000 [IU] | Freq: Once | INTRAVENOUS | Status: AC
  Administered 2023-04-10: 2500 [IU] via INTRAVENOUS
  Filled 2023-04-10: qty 2500

## 2023-04-10 MED ORDER — SODIUM CHLORIDE 0.9 % IV SOLN: INTRAVENOUS | Status: DC

## 2023-04-10 MED ORDER — IBUPROFEN 400 MG PO TABS: 200.0000 mg | ORAL_TABLET | Freq: Four times a day (QID) | ORAL | Status: DC | PRN

## 2023-04-10 MED ORDER — DILTIAZEM HCL 60 MG PO TABS
30.0000 mg | ORAL_TABLET | Freq: Once | ORAL | Status: AC
  Administered 2023-04-10: 30 mg via ORAL
  Filled 2023-04-10: qty 1

## 2023-04-10 MED ORDER — HEPARIN BOLUS VIA INFUSION
4000.0000 [IU] | Freq: Once | INTRAVENOUS | Status: AC
  Administered 2023-04-10: 4000 [IU] via INTRAVENOUS
  Filled 2023-04-10: qty 4000

## 2023-04-10 MED ORDER — HYDRALAZINE HCL 20 MG/ML IJ SOLN: 5.0000 mg | INTRAMUSCULAR | Status: DC | PRN

## 2023-04-10 MED ORDER — ACETAMINOPHEN 325 MG PO TABS: 650.0000 mg | ORAL_TABLET | Freq: Four times a day (QID) | ORAL | Status: DC | PRN

## 2023-04-10 MED ORDER — HEPARIN (PORCINE) 25000 UT/250ML-% IV SOLN
1450.0000 [IU]/h | INTRAVENOUS | Status: DC
  Administered 2023-04-10: 1200 [IU]/h via INTRAVENOUS
  Administered 2023-04-11: 1450 [IU]/h via INTRAVENOUS
  Filled 2023-04-10 (×2): qty 250

## 2023-04-10 MED ORDER — AMIODARONE HCL IN DEXTROSE 360-4.14 MG/200ML-% IV SOLN
60.0000 mg/h | INTRAVENOUS | Status: DC
  Administered 2023-04-10: 60 mg/h via INTRAVENOUS
  Filled 2023-04-10: qty 200

## 2023-04-10 MED ORDER — ONDANSETRON HCL 4 MG/2ML IJ SOLN: 4.0000 mg | Freq: Three times a day (TID) | INTRAMUSCULAR | Status: DC | PRN

## 2023-04-10 NOTE — ED Triage Notes (Signed)
Pt to ED via [facility] from urgent care for c/o weakness and dizziness. Pt Afib on EMS arrival with hr 90-170. Pt has hx Afib. EMS administered 500 mL NS and 10 cardizem.

## 2023-04-10 NOTE — H&P (Signed)
History and Physical    Vincent Sanchez ZOX:[id number] DOB: 1961/09/18 DOA: 04/10/2023  Referring MD/NP/PA:   PCP: Erasmo Downer, NP   Patient coming from:  The patient is coming from home.     Chief Complaint: Dizziness, weakness, heart racing  HPI: Vincent Sanchez is a 62 y.o. male with medical history significant of A-fib (s/p of ablation, not on anticoagulants), hypertension, hyperlipidemia, CHF with EF 45%, diverticulitis, hepatitis A, chronic abnormal liver function, who presents with dizziness, weakness and heart racing.  Pt states that he has hx of atrial fibrillation.  He had ablation in 2019  by Dr. Juliann Pares, and has not had recurrent A-fib since then.  He states that last night he started to feel very tired and not himself so he went to bed early. He continues to feel fatigue this morning.  He also reports heart racing and central chest discomfort, denies active chest pain.  No shortness of breath, cough, fever or chills. Per EMS report, patient has A-fib with RVR, heart rate up to 170. Pt was given 500 bolus of IV fluids and then 10 mg of IV Cardizem.  Heart rate improved to 70.  When I saw pt in ED, he feels much better.  Heart rate is 60-70s.  Patient does not have nausea, vomiting, diarrhea or abdominal pain.  No symptoms of UTI.  Data reviewed independently and ED Course: pt was found to have WBC 10.1, troponin level 14, GFR> 60, temperature normal, blood pressure 124/80, RR 16, oxygen saturation 99% on room air.  Chest x-ray negative.  CT of head is negative.  Patient is placed on PCU for obs. Dr. Juliann Pares of cardiology is consulted.   EKG: I have personally reviewed.  Atrial fibrillation, QTc 557, heart rate 82, early R wave progression   Review of Systems:   General: no fevers, chills, no body weight gain, has fatigue HEENT: no blurry vision, hearing changes or sore throat Respiratory: no dyspnea, coughing, wheezing CV: Has chest discomfort  and heart racing GI: no nausea, vomiting, abdominal pain, diarrhea, constipation GU: no dysuria, burning on urination, increased urinary frequency, hematuria  Ext: no leg edema Neuro: no unilateral weakness, numbness, or tingling, no vision change or hearing loss Skin: no rash, no skin tear. MSK: No muscle spasm, no deformity, no limitation of range of movement in spin Heme: No easy bruising.  Travel history: No recent long distant travel.   Allergy: No Known Allergies  Past Medical History:  Diagnosis Date   Diverticulitis 2013   Hepatitis A 1984   Hypertension     Past Surgical History:  Procedure Laterality Date   CARDIOVERSION N/A 10/12/2018   Procedure: CARDIOVERSION;  Surgeon: Alwyn Pea, MD;  Location: [facility] ORS;  Service: Cardiovascular;  Laterality: N/A;   CHOLECYSTECTOMY  08/08/14   COLONOSCOPY  July 2014   Dr Servando Snare   TONSILLECTOMY     age 33    Social History:  reports that he quit smoking about 36 years ago. He has never used smokeless tobacco. He reports that he does not drink alcohol and does not use drugs.  Family History:  Family History  Problem Relation Age of Onset   Diabetes Father    Cancer Mother    Cancer Maternal Grandfather      Prior to Admission medications   Medication Sig Start Date End Date Taking? Authorizing Provider  folic acid (FOLVITE) 1 MG tablet Take 1 mg by mouth daily.   Yes [provider]  Glucosamine-Chondroit-Vit C-Mn (GLUCOSAMINE 1500 COMPLEX) CAPS Take 2 capsules by mouth daily.   Yes [provider]  amiodarone (PACERONE) 200 MG tablet Take 200 mg by mouth daily. Patient not taking: Reported on 04/10/2023    [provider]  apixaban (ELIQUIS) 5 MG TABS tablet Take 5 mg by mouth 2 (two) times daily. Patient not taking: Reported on 04/10/2023    [provider]  benazepril-hydrochlorthiazide (LOTENSIN HCT) 20-12.5 MG per tablet Take 1 tablet by mouth daily.  Patient not taking:  Reported on 04/10/2023 07/23/14   [provider]  cetirizine (ZYRTEC) 10 MG tablet Take 10 mg by mouth daily. Patient not taking: Reported on 04/10/2023    [provider]  diltiazem (CARDIZEM SR) 120 MG 12 hr capsule Take 120 mg by mouth 2 (two) times daily. Patient not taking: Reported on 04/10/2023    [provider]  doxycycline (VIBRAMYCIN) 100 MG capsule Take 1 capsule (100 mg total) by mouth 2 (two) times daily. Patient not taking: Reported on 04/10/2023 06/01/19   Ivery Quale, PA-C  furosemide (LASIX) 40 MG tablet Take 80 mg by mouth every morning.  Patient not taking: Reported on 04/10/2023 07/23/14   [provider]  potassium chloride (KLOR-CON M) 10 MEQ tablet Take 10 mEq by mouth daily. Patient not taking: Reported on 04/10/2023 10/24/22 10/24/23  [provider]    Physical Exam: Vitals:   04/10/23 1400 04/10/23 1430 04/10/23 1500 04/10/23 1630  BP: 105/67 113/79 116/69 112/83  Pulse: 60 74 85 73  Resp: 17 16 14 16   Temp:      TempSrc:      SpO2: 100% 98% 98% 98%  Weight:      Height:       General: Not in acute distress HEENT:       Eyes: PERRL, EOMI, no jaundice       ENT: No discharge from the ears and nose, no pharynx injection, no tonsillar enlargement.        Neck: No JVD, no bruit, no mass felt. Heme: No neck lymph node enlargement. Cardiac: S1/S2, RRR, irregularly irregular rhythm, No gallops or rubs. Respiratory: No rales, wheezing, rhonchi or rubs. GI: Soft, nondistended, nontender, no rebound pain, no organomegaly, BS present. GU: No hematuria Ext: No pitting leg edema bilaterally. 1+DP/PT pulse bilaterally. Musculoskeletal: No joint deformities, No joint redness or warmth, no limitation of ROM in spin. Skin: No rashes.  Neuro: Alert, oriented X3, cranial nerves II-XII grossly intact, moves all extremities normally. Psych: Patient is not psychotic, no suicidal or hemocidal ideation.  Labs on Admission: I have  personally reviewed following labs and imaging studies  CBC: Recent Labs  Lab 04/10/23 0950  WBC 10.1  HGB 15.2  HCT 46.2  MCV 98.1  PLT 221   Basic Metabolic Panel: Recent Labs  Lab 04/10/23 0950  NA 140  K 3.9  CL 108  CO2 27  GLUCOSE 96  BUN 17  CREATININE 0.69  CALCIUM 8.6*  MG 2.1   GFR: Estimated Creatinine Clearance: 109.9 mL/min (by C-G formula based on SCr of 0.69 mg/dL). Liver Function Tests: Recent Labs  Lab 04/10/23 0950  AST 87*  ALT 129*  ALKPHOS 81  BILITOT 1.2  PROT 6.2*  ALBUMIN 3.9   No results for input(s): "LIPASE", "AMYLASE" in the last 168 hours. No results for input(s): "AMMONIA" in the last 168 hours. Coagulation Profile: Recent Labs  Lab 04/10/23 0950  INR 1.3*   Cardiac Enzymes:  No results for input(s): "CKTOTAL", "CKMB", "CKMBINDEX", "TROPONINI" in the last 168 hours. BNP (last 3 results) No results for input(s): "PROBNP" in the last 8760 hours. HbA1C: No results for input(s): "HGBA1C" in the last 72 hours. CBG: No results for input(s): "GLUCAP" in the last 168 hours. Lipid Profile: No results for input(s): "CHOL", "HDL", "LDLCALC", "TRIG", "CHOLHDL", "LDLDIRECT" in the last 72 hours. Thyroid Function Tests: Recent Labs    04/10/23 0950  TSH 2.941   Anemia Panel: No results for input(s): "VITAMINB12", "FOLATE", "FERRITIN", "TIBC", "IRON", "RETICCTPCT" in the last 72 hours. Urine analysis: No results found for: "COLORURINE", "APPEARANCEUR", "LABSPEC", "PHURINE", "GLUCOSEU", "HGBUR", "BILIRUBINUR", "KETONESUR", "PROTEINUR", "UROBILINOGEN", "NITRITE", "LEUKOCYTESUR" Sepsis Labs: @LABRCNTIP (procalcitonin:4,lacticidven:4) )No results found for this or any previous visit (from the past 240 hour(s)).   Radiological Exams on Admission: CT Head Wo Contrast  Result Date: 04/10/2023 CLINICAL DATA:  Headache EXAM: CT HEAD WITHOUT CONTRAST TECHNIQUE: Contiguous axial images were obtained from the base of the skull through the  vertex without intravenous contrast. RADIATION DOSE REDUCTION: This exam was performed according to the departmental dose-optimization program which includes automated exposure control, adjustment of the mA and/or kV according to patient size and/or use of iterative reconstruction technique. COMPARISON:  None Available. FINDINGS: Brain: No evidence of acute infarction, hemorrhage, hydrocephalus, extra-axial collection or mass lesion/mass effect. Vascular: No hyperdense vessel or unexpected calcification. Skull: Normal. Negative for fracture or focal lesion. Sinuses/Orbits: No acute finding. Other: None. IMPRESSION: No acute intracranial pathology. Electronically Signed   By: Larose Hires D.O.   On: 04/10/2023 11:48   DG Chest 2 View  Result Date: 04/10/2023 CLINICAL DATA:  Chest pain EXAM: CHEST - 2 VIEW COMPARISON:  X-ray 02/12/2006 FINDINGS: Calcified aorta. No consolidation, pneumothorax or effusion. No edema. Normal cardiopericardial silhouette. Mild degenerative changes along the spine IMPRESSION: No acute cardiopulmonary disease Electronically Signed   By: Karen Kays M.D.   On: 04/10/2023 10:26      Assessment/Plan Principal Problem:   Atrial fibrillation with RVR (HCC) Active Problems:   Hypertension   Chronic systolic CHF (congestive heart failure) (HCC)   Hyperlipidemia   Abnormal LFTs   Obesity (BMI 30-39.9)   Assessment and Plan:  Atrial fibrillation with RVR (HCC): HR is up to 170s --> improved to 60-70 6 s.  CHADSVASC is 2 (HTN and sCHF). Consulted Dr. Juliann Pares of card, who recommended starting patient on low-dose of Cardizem and amiodarone gtt.  If patient does not convert, may need cardioversion tomorrow.  -will place in PCU tele bed for obs -IV heparin started in ED -Continue amiodarone drip per cards recommendation -Patient received 30 mg oral Cardizem in ED -check TSH --> normal 2.941 -NPO after MN  Hypertension: Bp is 124/80. Pt is not taking meds now -IV  hydralazine as needed  Chronic systolic CHF (congestive heart failure) (HCC): 2D echo on 11/27/2021 showed EF of 45%.  Patient is not taking diuretics.  No leg edema or JVD.  CHF seem to be compensated. -Check BNP --> 257  Hyperlipidemia: not taking meds now -f/u with PCP  Abnormal LFTs: ALP 81, AST 87, ALT 29, total bilirubin 1.2.  This is chronic issue.  Patient is less he is a Foley out.  Liver specialist in [place] -Follow-up in [place] -Avoid using Tylenol  Obesity (BMI 30-39.9): BMI 23.62, body weight 83.5 kg -Exercise and healthy diet -Encourage losing weight    DVT ppx: on IV Heparin   Code Status: Full code    Family Communication:  Yes, patient's wife  at bed side.     Disposition Plan:  Anticipate discharge back to previous environment  Consults called:  Dr. Juliann Pares of card  Admission status and Level of care: Progressive:   for obs    Dispo: The patient is from: Home              Anticipated d/c is to: Home              Anticipated d/c date is: 1 day              Patient currently is not medically stable to d/c.    Severity of Illness:  The appropriate patient status for this patient is OBSERVATION. Observation status is judged to be reasonable and necessary in order to provide the required intensity of service to ensure the patient's safety. The patient's presenting symptoms, physical exam findings, and initial radiographic and laboratory data in the context of their medical condition is felt to place them at decreased risk for further clinical deterioration. Furthermore, it is anticipated that the patient will be medically stable for discharge from the hospital within 2 midnights of admission.        Date of Service 04/10/2023    Lorretta Harp [facility]   If 7PM-7AM, please contact night-coverage [email or web address] 04/10/2023, 6:10 PM

## 2023-04-10 NOTE — Progress Notes (Signed)
ANTICOAGULATION CONSULT NOTE - Initial Consult  Pharmacy Consult for IV heparin Indication: atrial fibrillation  No Known Allergies  Patient Measurements:   Heparin Dosing Weight: 83.5 kg  Vital Signs: Temp: 98.5 F (36.9 C) (06/20 0944) Temp Source: Oral (06/20 0944) BP: 102/77 (06/20 1220) Pulse Rate: 69 (06/20 1220)  Labs: Recent Labs    04/10/23 0950  HGB 15.2  HCT 46.2  PLT 221  LABPROT 16.4*  INR 1.3*  CREATININE 0.69  TROPONINIHS 14    CrCl cannot be calculated (Unknown ideal weight.).   Medical History: Past Medical History:  Diagnosis Date   Diverticulitis 2013   Hepatitis A 1984   Hypertension     Medications:  No anticoagulation prior to admission  Assessment: 62 year old male presenting with fatigue and generalized weakness due to new onset atrial fibrillation. Pharmacy has been consulted to start IV heparin.  Goal of Therapy:  Heparin level 0.3-0.7 units/ml Monitor platelets by anticoagulation protocol: Yes   Plan:  Give 4000 units bolus x 1 Start heparin infusion at 1200 units/hr Check anti-Xa level in 6 hours and daily while on heparin Continue to monitor H&H and platelets    Elliot Gurney, PharmD, BCPS Clinical Pharmacist  04/10/2023 1:34 PM

## 2023-04-10 NOTE — Consult Note (Signed)
[facility] CARDIOLOGY CONSULT NOTE       Patient ID: Vincent Sanchez MRN: [id number] DOB/AGE: 11-29-1960 62 y.o.  Admit date: 04/10/2023 Referring Physician Dr. Corena Herter Primary Physician [facility]  Primary Cardiologist Dr. 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  Found to be in AF RVR when EMS arrived with rate up to 170bpm. Cardiology is consulted for further assistance with his atrial fibrillation.  Patient presents with his wife who contributes to the history.  The patient has a history of persistent atrial fibrillation status post 2 unsuccessful DCCV's and eventually underwent successful catheter ablation with Dr. Gerre Pebbles in January 2020.  He felt fantastic following the ablation with an immediate change in how he felt overall in terms of his energy and exercise tolerance.  History of morbid obesity and underwent successful bariatric surgery almost 2 years ago to the date with weight loss of around 150 pounds which she has successfully maintained since the surgery.  The patient has been off of rhythm control with amiodarone, diltiazem, and Eliquis since mid 2020 (low CHADSVASC of 1, at the time only HTN) and has otherwise been doing well from a cardiac perspective.  Patient states that he was watching TV yesterday night around 10:45pm and "just did not feel well."  He had a generally busy day and was staining his porch with his wife but made sure to stay hydrated.  This was not necessarily strenuous activity for him.  He woke up this morning with lightheadedness and presyncope with a sensation of "he had just give out."  He noted some chest discomfort that radiated to his neck  which felt like he did when he was in atrial fibrillation years ago.  He denied significant shortness of breath, heart racing or palpitations, lower extremity edema, orthopnea.  He presented initially to a local urgent care who referred him to the emergency department via EMS.  His heart rates were initially in the 170s and atrial fibrillation, he received IV diltiazem 10 mg x 1 with improvement in his heart rates to the 80s to 90s by the time he was in the emergency department at [facility].  DCCV was considered but ultimately deferred although the patient's symptom onset was less than 48 hours ago.  He was started on IV amiodarone and heparin infusions and was given p.o. diltiazem 30 mg x 1.  At my time of evaluation this afternoon he is laying at low incline in bed with his wife present.  He generally feels better than when he first presented without lightheadedness or dizziness, palpitations or chest discomfort that radiates to his throat.  He denies recent illness or sick contacts.  Since his bariatric surgery and weight loss success he has not needed his CPAP.  He retired last year, wife retired 2 weeks ago. Enjoy camping trips.    Review of systems complete and found to be negative unless listed above     Past Medical History:  Diagnosis Date   Diverticulitis 2013   Hepatitis A 1984   Hypertension     Past Surgical History:  Procedure Laterality Date   CARDIOVERSION N/A 63/23/2019   Procedure: CARDIOVERSION;  Surgeon: Alwyn Pea, MD;  Location: [facility] ORS;  Service: Cardiovascular;  Laterality: N/A;   CHOLECYSTECTOMY  08/08/14   COLONOSCOPY  July 2014   Dr Servando Snare   TONSILLECTOMY     age 62    (Not in a hospital admission)  Social History   Socioeconomic History   Marital status: Married    Spouse name: Not on file   Number of children: Not on file   Years of education: Not on file   Highest education level: Not on file  Occupational History   Not on file  Tobacco Use    Smoking status: Former    Years: 14    Types: Cigarettes    Quit date: 10/21/1986    Years since quitting: 36.4   Smokeless tobacco: Never  Substance and Sexual Activity   Alcohol use: No   Drug use: No   Sexual activity: Not on file  Other Topics Concern   Not on file  Social History Narrative   Not on file   Social Determinants of Health   Financial Resource Strain: Not on file  Food Insecurity: Not on file  Transportation Needs: Not on file  Physical Activity: Not on file  Stress: Not on file  Social Connections: Not on file  Intimate Partner Violence: Not on file    Family History  Problem Relation Age of Onset   Diabetes Father    Cancer Mother    Cancer Maternal Grandfather       Intake/Output Summary (Last 24 hours) at 04/10/2023 1606 Last data filed at 04/10/2023 1518 Gross per 24 hour  Intake 195.6 ml  Output --  Net 195.6 ml    Vitals:   04/10/23 1357 04/10/23 1400 04/10/23 1430 04/10/23 1500  BP:  105/67 113/79 116/69  Pulse:  60 74 85  Resp:  17 16 14   Temp: 98 F (36.7 C)     TempSrc: Oral     SpO2:  100% 98% 98%  Weight:      Height:        PHYSICAL EXAM General: Pleasant conversational Caucasian male, well nourished, in no acute distress.  Laying at low incline in ED stretcher with wife present. HEENT:  Normocephalic and atraumatic. Neck:  No JVD.  Lungs: Normal respiratory effort on room air. Clear bilaterally to auscultation. No wheezes, crackles, rhonchi.  Heart: Irregularly irregular with controlled rate. Normal S1 and S2 without gallops or murmurs.  Abdomen: Non-distended appearing.  Msk: Normal strength and tone for age. Extremities: Warm and well perfused. No clubbing, cyanosis.  Trace bilateral lower extremity edema.  Neuro: Alert and oriented X 3. Psych:  Answers questions appropriately.   Labs: Basic Metabolic Panel: Recent Labs    04/10/23 0950  NA 140  K 3.9  CL 108  CO2 27  GLUCOSE 96  BUN 17  CREATININE 0.69   CALCIUM 8.6*  MG 2.1   Liver Function Tests: Recent Labs    04/10/23 0950  AST 87*  ALT 129*  ALKPHOS 81  BILITOT 1.2  PROT 6.2*  ALBUMIN 3.9   No results for input(s): "LIPASE", "AMYLASE" in the last 72 hours. CBC: Recent Labs    04/10/23 0950  WBC 10.1  HGB 15.2  HCT 46.2  MCV 98.1  PLT 221   Cardiac Enzymes: Recent Labs    04/10/23 0950 04/10/23 1234  TROPONINIHS 14 11   BNP: Recent Labs    04/10/23 0950  BNP 257.8*   D-Dimer: No results for input(s): "DDIMER" in the last 72 hours. Hemoglobin A1C: No results for input(s): "  HGBA1C" in the last 72 hours. Fasting Lipid Panel: No results for input(s): "CHOL", "HDL", "LDLCALC", "TRIG", "CHOLHDL", "LDLDIRECT" in the last 72 hours. Thyroid Function Tests: Recent Labs    04/10/23 0950  TSH 2.941   Anemia Panel: No results for input(s): "VITAMINB12", "FOLATE", "FERRITIN", "TIBC", "IRON", "RETICCTPCT" in the last 72 hours.   Radiology: CT Head Wo Contrast  Result Date: 04/10/2023 CLINICAL DATA:  Headache EXAM: CT HEAD WITHOUT CONTRAST TECHNIQUE: Contiguous axial images were obtained from the base of the skull through the vertex without intravenous contrast. RADIATION DOSE REDUCTION: This exam was performed according to the departmental dose-optimization program which includes automated exposure control, adjustment of the mA and/or kV according to patient size and/or use of iterative reconstruction technique. COMPARISON:  None Available. FINDINGS: Brain: No evidence of acute infarction, hemorrhage, hydrocephalus, extra-axial collection or mass lesion/mass effect. Vascular: No hyperdense vessel or unexpected calcification. Skull: Normal. Negative for fracture or focal lesion. Sinuses/Orbits: No acute finding. Other: None. IMPRESSION: No acute intracranial pathology. Electronically Signed   By: Larose Hires D.O.   On: 04/10/2023 11:48   DG Chest 2 View  Result Date: 04/10/2023 CLINICAL DATA:  Chest pain EXAM: CHEST  - 2 VIEW COMPARISON:  X-ray 02/12/2006 FINDINGS: Calcified aorta. No consolidation, pneumothorax or effusion. No edema. Normal cardiopericardial silhouette. Mild degenerative changes along the spine IMPRESSION: No acute cardiopulmonary disease Electronically Signed   By: Karen Kays M.D.   On: 04/10/2023 10:26    ECHO 11/27/2021 DOPPLER ECHO and OTHER SPECIAL PROCEDURES ------------------------------------     Aortic: TRIVIAL AR             No AS      Mitral: MILD MR                No MS     MV Inflow E Vel.= 54.0 cm/s  MV Annulus E'Vel.= 8.0 cm/s  E/E'Ratio= 7   Tricuspid: TRIVIAL TR             No TS             2.4 m/s peak TR vel   39 mmHg peak RV pressure   Pulmonary: TRIVIAL PR             No PS       Other:             DEFINITY CONTRAST SHOWS ENHANCED LV BORDERS   INTERPRETATION ---------------------------------------------------------------    MILD LV DYSFUNCTION (See above) WITH MILD LVH    NORMAL LA PRESSURES WITH DIASTOLIC DYSFUNCTION    NORMAL RIGHT VENTRICULAR SYSTOLIC FUNCTION    VALVULAR REGURGITATION: TRIVIAL AR, MILD MR, TRIVIAL PR, TRIVIAL TR    NO VALVULAR STENOSIS    EF 45-50%    NO PRIOR STUDY FOR COMPARISON    (Report version 3.0)                    Interpreted and Electronically signed   Perform. by: Graciela Husbands, RDCS                     by: Blaine Hamper, MD   TELEMETRY reviewed by me (LT) 04/10/2023 : AF rate mid 50s-70s with short <2 second pauses  EKG reviewed by me: AF controlled V response rate 82  Data reviewed by me (LT) 04/10/2023: last EP note, last bariatric surgery note, ed note last 24h vitals tele labs imaging I/O    Principal Problem:   Atrial fibrillation  with RVR (HCC) Active Problems:   Hypertension   Hyperlipidemia   Chronic systolic CHF (congestive heart failure) (HCC)   Obesity (BMI 30-39.9)    ASSESSMENT AND PLAN:  Vincent Sanchez is a 62yoM with a PMH of persistent AF s/p successful catheter ablation (10/2018),  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  Found to be in AF RVR when EMS arrived with rate up to 170bpm. Cardiology is consulted for further assistance with his atrial fibrillation.  # paroxysmal AF RVR  # hx successful AF ablation (10/2018) Clear symptom onset around 10:45 PM on 6/19 of lightheadedness, generalized malaise, and some discomfort that radiates to his throat similar to years ago when he was in atrial fibrillation.  With EMS rates were as high as 170s, much more controlled and even bradycardic while on IV amiodarone in the high 50s to low 60s.  TSH within normal limits, no marked electrolyte disturbances or recent illness as a clear cause of his recurrent A-fib. -Continue IV amiodarone overnight, but decrease rate to 30 mg/h -Hold further BB, CCB -Continue IV heparin, likely switch to Eliquis 5 mg twice daily for a period of time following tentative DCCV. CHADSVASC 0  -Will reevaluate patient in the morning and continue monitoring on telemetry.  Will plan for tentative DCCV should the patient remain in atrial fibrillation tomorrow, 6/21 at 12:30 PM with Dr. Dorothyann Peng. Discussed the risks and benefits of this procedure with the patient and his wife and they are in agreement to proceed.  Will make n.p.o. at midnight.  # chronic HFmrEF (45-50%)  Euvolemic on exam, will repeat echo complete.  This patient's plan of care was discussed and created with Dr. Juliann Pares and he is in agreement.  Signed: Rebeca Allegra , PA-C 04/10/2023, 4:06 PM [facility]

## 2023-04-10 NOTE — ED Provider Notes (Signed)
[facility] Provider Note    Event Date/Time   First MD Initiated Contact with Patient 04/10/23 0940     (approximate)   History   Irregular Heart Beat and Weakness   HPI  Vincent Sanchez is a 62 y.o. male past medical history significant for prior atrial fibrillation status post ablation in 2019 at [facility] followed by Dr. Juliann Pares, who presents to the emergency department with fatigue and generalized weakness.  Patient states that last night started to feel very tired and not himself so he went to bed early.  Woke up whenever he went to go to the bathroom and felt swimmy headed with generalized weakness and fatigue.  Stated that he felt like he had a discomfort in his throat and left chest wall.  When EMS arrived patient was found to be in atrial fibrillation with rapid rate up to 170.  Given a 500 bolus of IV fluids and then 10 mg of IV Cardizem.  Heart rate improved to 70.  States that he is feeling much better and no complaints at this time.  Denies any fever or chills.  Denies cough or shortness of breath.  Denies nausea, vomiting, diarrhea or melena.  No dysuria, urinary urgency or frequency.  Ablation done in 2019, was on anticoagulation and medications for atrial fibrillation prior to his ablation, no episodes of atrial fibrillation since that time and no longer on anticoagulation.     Physical Exam   Triage Vital Signs: ED Triage Vitals  Enc Vitals Group     BP      Pulse      Resp      Temp      Temp src      SpO2      Weight      Height      Head Circumference      Peak Flow      Pain Score      Pain Loc      Pain Edu?      Excl. in GC?     Most recent vital signs: Vitals:   04/10/23 0944 04/10/23 1220  BP: 124/80 102/77  Pulse: 63 69  Resp: 16 15  Temp: 98.5 F (36.9 C)   SpO2: 99% 99%    Physical Exam Constitutional:      Appearance: He is well-developed.  HENT:     Head: Atraumatic.  Eyes:     Conjunctiva/sclera:  Conjunctivae normal.  Cardiovascular:     Rate and Rhythm: Rhythm irregular.     Heart sounds: No murmur heard. Pulmonary:     Effort: No respiratory distress.  Musculoskeletal:        General: Normal range of motion.     Cervical back: Normal range of motion.     Right lower leg: No edema.     Left lower leg: No edema.  Skin:    General: Skin is warm.  Neurological:     Mental Status: He is alert. Mental status is at baseline.     IMPRESSION / MDM / ASSESSMENT AND PLAN / ED COURSE  I reviewed the triage vital signs and the nursing notes.  Differential diagnosis including atrial fibrillation, anemia, electrolyte abnormality, dehydration, infectious process  EMS gave 500 cc bolus of normal saline and IV 10 mg of diltiazem  EKG  I, Corena Herter, the attending physician, personally viewed and interpreted this ECG.  Atrial fibrillation with a rate of 82.  QTc 457.  Narrow complex.  No significant ST elevation or depression.  No signs of acute ischemia or dysrhythmia.  Atrial fibrillation while on cardiac telemetry.  RADIOLOGY I independently reviewed imaging, my interpretation of imaging: Chest x-ray -no signs of pneumonia  LABS (all labs ordered are listed, but only abnormal results are displayed) Labs interpreted as -    Labs Reviewed  COMPREHENSIVE METABOLIC PANEL - Abnormal; Notable for the following components:      Result Value   Calcium 8.6 (*)    Total Protein 6.2 (*)    AST 87 (*)    ALT 129 (*)    All other components within normal limits  PROTIME-INR - Abnormal; Notable for the following components:   Prothrombin Time 16.4 (*)    INR 1.3 (*)    All other components within normal limits  CBC  MAGNESIUM  BRAIN NATRIURETIC PEPTIDE  TROPONIN I (HIGH SENSITIVITY)  TROPONIN I (HIGH SENSITIVITY)     MDM  New onset atrial fibrillation.  Patient will be within the window of cardioversion given his symptoms have been less than 24 hours.  Consulted and  discussed the patient's case with cardiology Dr. Juliann Pares.  Will start the patient on heparin bolus and infusion, amnio bolus and infusion and a low-dose Cardizem.  Consulted hospitalist for admission.     PROCEDURES:  Critical Care performed: yes  .Critical Care  Performed by: Corena Herter, MD Authorized by: Corena Herter, MD   Critical care provider statement:    Critical care time (minutes):  30   Critical care time was exclusive of:  Separately billable procedures and treating other patients   Critical care was necessary to treat or prevent imminent or life-threatening deterioration of the following conditions:  Cardiac failure   Critical care was time spent personally by me on the following activities:  Development of treatment plan with patient or surrogate, discussions with consultants, evaluation of patient's response to treatment, examination of patient, ordering and review of laboratory studies, ordering and review of radiographic studies, ordering and performing treatments and interventions, pulse oximetry, re-evaluation of patient's condition and review of old charts   Patient's presentation is most consistent with acute presentation with potential threat to life or bodily function.   MEDICATIONS ORDERED IN ED: Medications  amiodarone (NEXTERONE) 1.8 mg/mL load via infusion 150 mg (has no administration in time range)    Followed by  amiodarone (NEXTERONE PREMIX) 360-4.14 MG/200ML-% (1.8 mg/mL) IV infusion (has no administration in time range)    Followed by  amiodarone (NEXTERONE PREMIX) 360-4.14 MG/200ML-% (1.8 mg/mL) IV infusion (has no administration in time range)  diltiazem (CARDIZEM) tablet 30 mg (has no administration in time range)  ondansetron (ZOFRAN) injection 4 mg (has no administration in time range)  hydrALAZINE (APRESOLINE) injection 5 mg (has no administration in time range)    FINAL CLINICAL IMPRESSION(S) / ED DIAGNOSES   Final diagnoses:   Weakness  Paroxysmal atrial fibrillation (HCC)     Rx / DC Orders   ED Discharge Orders          Ordered    Amb referral to [facility]        04/10/23 1249             Note:  This document was prepared using Dragon voice recognition software and may include unintentional dictation errors.   Corena Herter, MD 04/10/23 1250

## 2023-04-10 NOTE — Progress Notes (Addendum)
ANTICOAGULATION CONSULT NOTE  Pharmacy Consult for IV Heparin Indication: atrial fibrillation  Patient Measurements: Height: 6\' 2"  (188 cm) Weight: 83.5 kg (184 lb) IBW/kg (Calculated) : 82.2 Heparin Dosing Weight: 83.5 kg  Labs: Recent Labs    04/10/23 0950 04/10/23 1234 04/10/23 1948  HGB 15.2  --   --   HCT 46.2  --   --   PLT 221  --   --   LABPROT 16.4*  --   --   INR 1.3*  --   --   HEPARINUNFRC  --   --  0.11*  CREATININE 0.69  --   --   TROPONINIHS 14 11  --     Estimated Creatinine Clearance: 109.9 mL/min (by C-G formula based on SCr of 0.69 mg/dL).  Medical History: Past Medical History:  Diagnosis Date   Diverticulitis 2013   Hepatitis A 1984   Hypertension     Medications:  No anticoagulation prior to admission  Assessment: 62 year old male presenting with fatigue and generalized weakness due to new onset atrial fibrillation. Pharmacy has been consulted to start IV heparin.  0620 1948 HL 0.11, subthera; heparin stopped on MAR at 1939 due to IV issue. Discussed with RN, infusion was only off for approximately 4 minutes and then re-started  Goal of Therapy:  Heparin level 0.3-0.7 units/ml Monitor platelets by anticoagulation protocol: Yes   Plan:  --Heparin level is subtherapeutic --Heparin 2500 unit IV bolus and increase heparin infusion rate to 1450 units/hr --Re-check HL in 6 hours --Daily CBC per protocol while on IV heparin  Tressie Ellis 04/10/2023 8:28 PM

## 2023-04-11 ENCOUNTER — Other Ambulatory Visit (HOSPITAL_COMMUNITY): Payer: Self-pay

## 2023-04-11 ENCOUNTER — Observation Stay: Payer: BC Managed Care – PPO | Admitting: Certified Registered Nurse Anesthetist

## 2023-04-11 ENCOUNTER — Encounter
Admission: EM | Disposition: A | Discharge status: Home / Self Care | Payer: Self-pay | Source: Home / Self Care | Attending: Emergency Medicine

## 2023-04-11 ENCOUNTER — Encounter: Payer: Self-pay | Admitting: Internal Medicine

## 2023-04-11 ENCOUNTER — Observation Stay
Admit: 2023-04-11 | Discharge: 2023-04-11 | Disposition: A | Discharge status: Home / Self Care | Payer: BC Managed Care – PPO | Attending: Cardiology | Admitting: Cardiology

## 2023-04-11 DIAGNOSIS — R001 Bradycardia, unspecified: Secondary | ICD-10-CM

## 2023-04-11 DIAGNOSIS — I4891 Unspecified atrial fibrillation: Secondary | ICD-10-CM

## 2023-04-11 HISTORY — PX: CARDIOVERSION: SHX1299

## 2023-04-11 LAB — BASIC METABOLIC PANEL
Anion gap: 5 (ref 5–15)
BUN: 10 mg/dL (ref 8–23)
CO2: 22 mmol/L (ref 22–32)
Calcium: 8 mg/dL — ABNORMAL LOW (ref 8.9–10.3)
Chloride: 110 mmol/L (ref 98–111)
Creatinine, Ser: 0.52 mg/dL — ABNORMAL LOW (ref 0.61–1.24)
GFR, Estimated: >60 mL/min (ref >60)
Glucose, Bld: 99 mg/dL (ref 70–99)
Potassium: 3.6 mmol/L (ref 3.5–5.1)
Sodium: 137 mmol/L (ref 135–145)

## 2023-04-11 LAB — CBC
HCT: 41.9 % (ref 39.0–52.0)
Hemoglobin: 14.4 g/dL (ref 13.0–17.0)
MCH: 32.7 pg (ref 26.0–34.0)
MCHC: 34.4 g/dL (ref 30.0–36.0)
MCV: 95.2 fL (ref 80.0–100.0)
Platelets: 191 10*3/uL (ref 150–400)
RBC: 4.4 MIL/uL (ref 4.22–5.81)
RDW: 12.4 % (ref 11.5–15.5)
WBC: 10.1 10*3/uL (ref 4.0–10.5)
nRBC: 0 % (ref 0.0–0.2)

## 2023-04-11 LAB — HEPARIN LEVEL (UNFRACTIONATED)
Heparin Unfractionated: 0.37 IU/mL (ref 0.30–0.70)
Heparin Unfractionated: 0.39 IU/mL (ref 0.30–0.70)

## 2023-04-11 LAB — HIV ANTIBODY (ROUTINE TESTING W REFLEX): HIV Screen 4th Generation wRfx: NONREACTIVE

## 2023-04-11 SURGERY — CARDIOVERSION
Anesthesia: General

## 2023-04-11 MED ORDER — PROPOFOL 10 MG/ML IV BOLUS
INTRAVENOUS | Status: DC | PRN
  Administered 2023-04-11: 50 mg via INTRAVENOUS

## 2023-04-11 MED ORDER — METOPROLOL TARTRATE 25 MG PO TABS: 12.5000 mg | ORAL_TABLET | Freq: Two times a day (BID) | ORAL | 0 refills | Status: DC

## 2023-04-11 MED ORDER — AMIODARONE HCL 200 MG PO TABS: 400.0000 mg | ORAL_TABLET | Freq: Two times a day (BID) | ORAL | Status: DC

## 2023-04-11 MED ORDER — METOPROLOL TARTRATE 25 MG PO TABS
12.5000 mg | ORAL_TABLET | Freq: Two times a day (BID) | ORAL | Status: DC
  Administered 2023-04-11: 12.5 mg via ORAL
  Filled 2023-04-11: qty 1

## 2023-04-11 MED ORDER — AMIODARONE HCL 200 MG PO TABS: 200.0000 mg | ORAL_TABLET | Freq: Every day | ORAL | 0 refills | Status: AC

## 2023-04-11 MED ORDER — AMIODARONE HCL 400 MG PO TABS: 400.0000 mg | ORAL_TABLET | Freq: Two times a day (BID) | ORAL | 0 refills | Status: DC

## 2023-04-11 MED ORDER — APIXABAN 5 MG PO TABS: 5.0000 mg | ORAL_TABLET | Freq: Two times a day (BID) | ORAL | 0 refills | Status: AC

## 2023-04-11 MED ORDER — AMIODARONE HCL 200 MG PO TABS: 200.0000 mg | ORAL_TABLET | Freq: Every day | ORAL | Status: DC

## 2023-04-11 MED ORDER — POTASSIUM CHLORIDE CRYS ER 20 MEQ PO TBCR
40.0000 meq | EXTENDED_RELEASE_TABLET | Freq: Once | ORAL | Status: AC
  Administered 2023-04-11: 40 meq via ORAL
  Filled 2023-04-11: qty 2

## 2023-04-11 MED ORDER — APIXABAN 5 MG PO TABS: 5.0000 mg | ORAL_TABLET | Freq: Two times a day (BID) | ORAL | Status: DC

## 2023-04-11 NOTE — Transfer of Care (Signed)
Immediate Anesthesia Transfer of Care Note  Patient: Vincent Sanchez  Procedure(s) Performed: CARDIOVERSION  Patient Location: PACU  Anesthesia Type:General  Level of Consciousness: drowsy  Airway & Oxygen Therapy: Patient Spontanous Breathing and Patient connected to nasal cannula oxygen  Post-op Assessment: Report given to RN and Post -op Vital signs reviewed and stable  Post vital signs: Reviewed and stable  Last Vitals:  Vitals Value Taken Time  BP 118/68 04/11/23 1230  Temp    Pulse 46 04/11/23 1236  Resp 13 04/11/23 1236  SpO2 99 % 04/11/23 1236    Last Pain:  Vitals:   04/11/23 1157  TempSrc: Oral  PainSc: 0-No pain         Complications: No notable events documented.

## 2023-04-11 NOTE — Progress Notes (Signed)
ANTICOAGULATION CONSULT NOTE  Pharmacy Consult for IV Heparin Indication: atrial fibrillation  Patient Measurements: Height: 6\' 2"  (188 cm) Weight: 83.5 kg (184 lb) IBW/kg (Calculated) : 82.2 Heparin Dosing Weight: 83.5 kg  Labs: Recent Labs    04/10/23 0950 04/10/23 1234 04/10/23 1948 04/11/23 0256  HGB 15.2  --   --  14.4  HCT 46.2  --   --  41.9  PLT 221  --   --  191  LABPROT 16.4*  --   --   --   INR 1.3*  --   --   --   HEPARINUNFRC  --   --  0.11* 0.37  CREATININE 0.69  --   --  0.52*  TROPONINIHS 14 11  --   --     Estimated Creatinine Clearance: 109.9 mL/min (A) (by C-G formula based on SCr of 0.52 mg/dL (L)).  Medical History: Past Medical History:  Diagnosis Date   Diverticulitis 2013   Hepatitis A 1984   Hypertension     Medications:  No anticoagulation prior to admission  Assessment: 62 year old male presenting with fatigue and generalized weakness due to new onset atrial fibrillation. Pharmacy has been consulted to start IV heparin.  0620 1948 HL 0.11, subthera; heparin stopped on MAR at 1939 due to IV issue. Discussed with RN, infusion was only off for approximately 4 minutes and then re-started  Goal of Therapy:  Heparin level 0.3-0.7 units/ml Monitor platelets by anticoagulation protocol: Yes   Plan:  6/21:  HL @ 0256 = 0.37, therapeutic X 1 --Continue pt on current rate and Re-check HL in 6 hours --Daily CBC per protocol while on IV heparin  Lilyana Lippman D 04/11/2023 3:41 AM

## 2023-04-11 NOTE — Anesthesia Preprocedure Evaluation (Signed)
Anesthesia Evaluation  Patient identified by MRN, date of birth, ID band Patient awake    Reviewed: Allergy & Precautions, NPO status , Patient's Chart, lab work & pertinent test results  History of Anesthesia Complications Negative for: history of anesthetic complications  Airway Mallampati: III  TM Distance: <3 FB Neck ROM: full    Dental  (+) Chipped   Pulmonary neg shortness of breath, former smoker   Pulmonary exam normal        Cardiovascular Exercise Tolerance: Good hypertension, +CHF  (-) Past MI + dysrhythmias Atrial Fibrillation  Rhythm:irregular Rate:Normal     Neuro/Psych negative neurological ROS  negative psych ROS   GI/Hepatic negative GI ROS, Neg liver ROS,neg GERD  ,,  Endo/Other  negative endocrine ROS    Renal/GU negative Renal ROS  negative genitourinary   Musculoskeletal   Abdominal   Peds  Hematology negative hematology ROS (+)   Anesthesia Other Findings Past Medical History: 2013: Diverticulitis 1984: Hepatitis A No date: Hypertension  Past Surgical History: 10/12/2018: CARDIOVERSION; N/A     Comment:  Procedure: CARDIOVERSION;  Surgeon: Alwyn Pea,               MD;  Location: [facility] ORS;  Service: Cardiovascular;                Laterality: N/A; 08/08/14: CHOLECYSTECTOMY July 2014: COLONOSCOPY     Comment:  Dr Servando Snare No date: TONSILLECTOMY     Comment:  age 36  BMI    Body Mass Index: 23.52 kg/m      Reproductive/Obstetrics negative OB ROS                             Anesthesia Physical Anesthesia Plan  ASA: 3  Anesthesia Plan: General   Post-op Pain Management:    Induction: Intravenous  PONV Risk Score and Plan: Propofol infusion and TIVA  Airway Management Planned: Natural Airway and Nasal Cannula  Additional Equipment:   Intra-op Plan:   Post-operative Plan:   Informed Consent: I have reviewed the patients History and  Physical, chart, labs and discussed the procedure including the risks, benefits and alternatives for the proposed anesthesia with the patient or authorized representative who has indicated his/her understanding and acceptance.     Dental Advisory Given  Plan Discussed with: Anesthesiologist, CRNA and Surgeon  Anesthesia Plan Comments: (Patient consented for risks of anesthesia including but not limited to:  - adverse reactions to medications - risk of airway placement if required - damage to eyes, teeth, lips or other oral mucosa - nerve damage due to positioning  - sore throat or hoarseness - Damage to heart, brain, nerves, lungs, other parts of body or loss of life  Patient voiced understanding.)       Anesthesia Quick Evaluation

## 2023-04-11 NOTE — Progress Notes (Signed)
ANTICOAGULATION CONSULT NOTE  Pharmacy Consult for IV Heparin Indication: atrial fibrillation  Patient Measurements: Height: 6\' 2"  (188 cm) Weight: 83.1 kg (183 lb 3.2 oz) IBW/kg (Calculated) : 82.2 Heparin Dosing Weight: 83.5 kg  Labs: Recent Labs    04/10/23 0950 04/10/23 1234 04/10/23 1948 04/11/23 0256 04/11/23 0829  HGB 15.2  --   --  14.4  --   HCT 46.2  --   --  41.9  --   PLT 221  --   --  191  --   LABPROT 16.4*  --   --   --   --   INR 1.3*  --   --   --   --   HEPARINUNFRC  --   --  0.11* 0.37 0.39  CREATININE 0.69  --   --  0.52*  --   TROPONINIHS 14 11  --   --   --     Estimated Creatinine Clearance: 109.9 mL/min (A) (by C-G formula based on SCr of 0.52 mg/dL (L)).  Medical History: Past Medical History:  Diagnosis Date   Diverticulitis 2013   Hepatitis A 1984   Hypertension     Medications:  No anticoagulation prior to admission  Assessment: 62 year old male presenting with fatigue and generalized weakness due to new onset atrial fibrillation. Pharmacy has been consulted to start IV heparin.  0620 1948 HL 0.11, subthera; heparin stopped on MAR at 1939 due to IV issue. Discussed with RN, infusion was only off for approximately 4 minutes and then re-started  Goal of Therapy:  Heparin level 0.3-0.7 units/ml Monitor platelets by anticoagulation protocol: Yes   Plan:  --Heparin level is therapeutic x 2 --Continue heparin infusion at 1450 units/hr --Re-check HL / CBC tomorrow AM  Tressie Ellis 04/11/2023 9:33 AM

## 2023-04-11 NOTE — Discharge Summary (Addendum)
Physician Discharge Summary   Patient: Vincent Sanchez MRN: [id number] DOB: [birth year]  Admit date:     04/10/2023  Discharge date: 04/11/23  Discharge Physician: Lurene Shadow   PCP: Erasmo Downer, NP   Recommendations at discharge:   Follow-up with Dr. Juliann Pares, cardiologist, in 1 week  Discharge Diagnoses: Principal Problem:   Atrial fibrillation with RVR (HCC) Active Problems:   Hypertension   Chronic systolic CHF (congestive heart failure) (HCC)   Hyperlipidemia   Abnormal LFTs   Obesity (BMI 30-39.9)  Resolved Problems:   * No resolved hospital problems. [facility] Course:  Vincent Sanchez is a 62 y.o. male with medical history significant of A-fib (s/p unsuccessful cardioversion and subsequent ablation in January 2020, no longer on Eliquis), hypertension, hyperlipidemia, CHF with EF 45%, diverticulitis, hepatitis A, history of bariatric surgery not resulted in a 150 pound weight loss, chronic abnormal liver function, who presented to the hospital with dizziness, general weakness and irregular heartbeat.  When EMS arrived, patient was in rapid atrial fibrillation with heart rate up to the 170s.  He was given bolus of normal saline and 10 mg of IV Cardizem.  Heart rate improved to the 70s and was transported to the ED for further management.  He was admitted to the hospital for atrial fibrillation with rapid ventricular response.  He was in and out of atrial fibrillation with intermittent tachycardia.  He was treated with IV amiodarone drip and IV heparin drip.  He was evaluated by the cardiologist and he underwent cardioversion with conversion to normal sinus rhythm.  All his symptoms have resolved and he is deemed stable for discharge from cardiologist's standpoint.  He will be discharged on amiodarone and Eliquis.  He was discharged on metoprolol because of bradycardia.  Plan discussed with his wife at the bedside.     Consultants:  Cardiologist Procedures performed: Cardioversion Disposition: Home Diet recommendation:  Discharge Diet Orders (From admission, onward)     Start     Ordered   04/11/23 0000  Diet - low sodium heart healthy        04/11/23 1458           Cardiac diet DISCHARGE MEDICATION: Allergies as of 04/11/2023   No Known Allergies      Medication List     STOP taking these medications    benazepril-hydrochlorthiazide 20-12.5 MG tablet Commonly known as: LOTENSIN HCT   cetirizine 10 MG tablet Commonly known as: ZYRTEC   diltiazem 120 MG 12 hr capsule Commonly known as: CARDIZEM SR   doxycycline 100 MG capsule Commonly known as: VIBRAMYCIN   furosemide 40 MG tablet Commonly known as: LASIX   potassium chloride 10 MEQ tablet Commonly known as: KLOR-CON M       TAKE these medications    amiodarone 400 MG tablet Commonly known as: PACERONE Take 1 tablet (400 mg total) by mouth 2 (two) times daily for 7 days. Start taking on: April 12, 2023 What changed:  medication strength how much to take when to take this   amiodarone 200 MG tablet Commonly known as: PACERONE Take 1 tablet (200 mg total) by mouth daily. Start taking on: April 19, 2023 What changed: You were already taking a medication with the same name, and this prescription was added. Make sure you understand how and when to take each.   apixaban 5 MG Tabs tablet Commonly known as: Eliquis Take 1 tablet (5 mg total) by mouth 2 (two) times daily.  folic acid 1 MG tablet Commonly known as: FOLVITE Take 1 mg by mouth daily.   Glucosamine 1500 Complex Caps Take 2 capsules by mouth daily.        Follow-up Information     Alwyn Pea, MD. Go in 1 week(s).   Specialties: Cardiology, Internal Medicine Why: Please call the office on Monday to make this appointment. Thanks! Contact information: [address] [place] [address] [phone]                Discharge Exam: Ceasar Mons  Weights   04/10/23 1328 04/11/23 0501  Weight: 83.5 kg 83.1 kg   GEN: NAD SKIN: Warm and dry EYES: No pallor or icterus ENT: MMM CV: RRR PULM: CTA B ABD: soft, ND, NT, +BS CNS: AAO x 3, non focal EXT: No edema or tenderness   Condition at discharge: good  The results of significant diagnostics from this hospitalization (including imaging, microbiology, ancillary and laboratory) are listed below for reference.   Imaging Studies: CT Head Wo Contrast  Result Date: 04/10/2023 CLINICAL DATA:  Headache EXAM: CT HEAD WITHOUT CONTRAST TECHNIQUE: Contiguous axial images were obtained from the base of the skull through the vertex without intravenous contrast. RADIATION DOSE REDUCTION: This exam was performed according to the departmental dose-optimization program which includes automated exposure control, adjustment of the mA and/or kV according to patient size and/or use of iterative reconstruction technique. COMPARISON:  None Available. FINDINGS: Brain: No evidence of acute infarction, hemorrhage, hydrocephalus, extra-axial collection or mass lesion/mass effect. Vascular: No hyperdense vessel or unexpected calcification. Skull: Normal. Negative for fracture or focal lesion. Sinuses/Orbits: No acute finding. Other: None. IMPRESSION: No acute intracranial pathology. Electronically Signed   By: Larose Hires D.O.   On: 04/10/2023 11:48   DG Chest 2 View  Result Date: 04/10/2023 CLINICAL DATA:  Chest pain EXAM: CHEST - 2 VIEW COMPARISON:  X-ray 02/12/2006 FINDINGS: Calcified aorta. No consolidation, pneumothorax or effusion. No edema. Normal cardiopericardial silhouette. Mild degenerative changes along the spine IMPRESSION: No acute cardiopulmonary disease Electronically Signed   By: Karen Kays M.D.   On: 04/10/2023 10:26    Microbiology: No results found for this or any previous visit.  Labs: CBC: Recent Labs  Lab 04/10/23 0950 04/11/23 0256  WBC 10.1 10.1  HGB 15.2 14.4  HCT 46.2 41.9   MCV 98.1 95.2  PLT 221 191   Basic Metabolic Panel: Recent Labs  Lab 04/10/23 0950 04/11/23 0256  NA 140 137  K 3.9 3.6  CL 108 110  CO2 27 22  GLUCOSE 96 99  BUN 17 10  CREATININE 0.69 0.52*  CALCIUM 8.6* 8.0*  MG 2.1  --    Liver Function Tests: Recent Labs  Lab 04/10/23 0950  AST 87*  ALT 129*  ALKPHOS 81  BILITOT 1.2  PROT 6.2*  ALBUMIN 3.9   CBG: No results for input(s): "GLUCAP" in the last 168 hours.  Discharge time spent: greater than 30 minutes.  Signed: Lurene Shadow, MD [facility] 04/11/2023

## 2023-04-11 NOTE — Anesthesia Postprocedure Evaluation (Signed)
Anesthesia Post Note  Patient: Vincent Sanchez  Procedure(s) Performed: CARDIOVERSION  Patient location during evaluation: Specials Recovery Anesthesia Type: General Level of consciousness: awake and alert Pain management: pain level controlled Vital Signs Assessment: post-procedure vital signs reviewed and stable Respiratory status: spontaneous breathing, nonlabored ventilation, respiratory function stable and patient connected to nasal cannula oxygen Cardiovascular status: blood pressure returned to baseline and stable Postop Assessment: no apparent nausea or vomiting Anesthetic complications: no   No notable events documented.   Last Vitals:  Vitals:   04/11/23 1245 04/11/23 1300  BP: 95/64 101/79  Pulse: (!) 49 (!) 52  Resp: 15 15  Temp:    SpO2: 98% 98%    Last Pain:  Vitals:   04/11/23 1300  TempSrc:   PainSc: 0-No pain                 Cleda Mccreedy Kiyra Slaubaugh

## 2023-04-11 NOTE — Plan of Care (Signed)
  Problem: Education: Goal: Knowledge of General Education information will improve Description: Including pain rating scale, medication(s)/side effects and non-pharmacologic comfort measures Outcome: Completed/Met   Problem: Health Behavior/Discharge Planning: Goal: Ability to manage health-related needs will improve Outcome: Completed/Met   Problem: Clinical Measurements: Goal: Ability to maintain clinical measurements within normal limits will improve Outcome: Completed/Met Goal: Will remain free from infection Outcome: Completed/Met Goal: Diagnostic test results will improve Outcome: Progressing Goal: Respiratory complications will improve Outcome: Completed/Met Goal: Cardiovascular complication will be avoided Outcome: Completed/Met   Problem: Clinical Measurements: Goal: Will remain free from infection Outcome: Completed/Met   Problem: Clinical Measurements: Goal: Diagnostic test results will improve Outcome: Progressing   Problem: Clinical Measurements: Goal: Respiratory complications will improve Outcome: Completed/Met   Problem: Clinical Measurements: Goal: Cardiovascular complication will be avoided Outcome: Completed/Met   Problem: Elimination: Goal: Will not experience complications related to bowel motility Outcome: Completed/Met Goal: Will not experience complications related to urinary retention Outcome: Completed/Met   Problem: Elimination: Goal: Will not experience complications related to urinary retention Outcome: Completed/Met

## 2023-04-11 NOTE — Plan of Care (Signed)
  Problem: Skin Integrity: Goal: Risk for impaired skin integrity will decrease Outcome: Completed/Met   Problem: Safety: Goal: Ability to remain free from injury will improve Outcome: Completed/Met   Problem: Elimination: Goal: Will not experience complications related to bowel motility Outcome: Completed/Met Goal: Will not experience complications related to urinary retention Outcome: Completed/Met   Problem: Clinical Measurements: Goal: Respiratory complications will improve Outcome: Completed/Met

## 2023-04-11 NOTE — Progress Notes (Signed)
[facility] CLINIC CARDIOLOGY CONSULT NOTE       Patient ID: Vincent Sanchez MRN: [id number] DOB/AGE: 1960-11-08 62 y.o.  Admit date: 04/10/2023 Referring Physician Dr. Corena Herter Primary Physician [facility]  Primary Cardiologist Dr. 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  Found to be in AF RVR when EMS arrived with rate up to 170bpm. Cardiology is consulted for further assistance with his atrial fibrillation.  Interval History :  - no acute events overnight - feel ok, didn't sleep well (roommate in shared room). Has some nonspecific positional discomfort in chest/back. No shortness of breath, anginal pain, heart racing, throat discomfort, or presyncope - on amiodarone infusion at 30mg /hr & heparin gtt. Level therapeutic when checked at 0256 - remain in AF on tele, rate predominately in the 70-90s with short paroxysms to 110s-120s    Review of systems complete and found to be negative unless listed above     Past Medical History:  Diagnosis Date   Diverticulitis 2013   Hepatitis A 1984   Hypertension     Past Surgical History:  Procedure Laterality Date   CARDIOVERSION N/A 10/12/2018   Procedure: CARDIOVERSION;  Surgeon: Alwyn Pea, MD;  Location: [facility] ORS;  Service: Cardiovascular;  Laterality: N/A;   CHOLECYSTECTOMY  08/08/14   COLONOSCOPY  July 2014   Dr Servando Snare   TONSILLECTOMY     age 70    Medications Prior to Admission  Medication Sig Dispense Refill Last Dose   folic acid (FOLVITE) 1 MG tablet Take 1 mg by mouth daily.   04/10/2023   Glucosamine-Chondroit-Vit C-Mn (GLUCOSAMINE 1500 COMPLEX) CAPS Take 2 capsules by mouth daily.   04/10/2023   amiodarone  (PACERONE) 200 MG tablet Take 200 mg by mouth daily. (Patient not taking: Reported on 04/10/2023)   Not Taking   apixaban (ELIQUIS) 5 MG TABS tablet Take 5 mg by mouth 2 (two) times daily. (Patient not taking: Reported on 04/10/2023)   Not Taking   benazepril-hydrochlorthiazide (LOTENSIN HCT) 20-12.5 MG per tablet Take 1 tablet by mouth daily.  (Patient not taking: Reported on 04/10/2023)   Not Taking   cetirizine (ZYRTEC) 10 MG tablet Take 10 mg by mouth daily. (Patient not taking: Reported on 04/10/2023)   Not Taking   diltiazem (CARDIZEM SR) 120 MG 12 hr capsule Take 120 mg by mouth 2 (two) times daily. (Patient not taking: Reported on 04/10/2023)   Not Taking   doxycycline (VIBRAMYCIN) 100 MG capsule Take 1 capsule (100 mg total) by mouth 2 (two) times daily. (Patient not taking: Reported on 04/10/2023) 14 capsule 0 Not Taking   furosemide (LASIX) 40 MG tablet Take 80 mg by mouth every morning.  (Patient not taking: Reported on 04/10/2023)   Not Taking   potassium chloride (KLOR-CON M) 10 MEQ tablet Take 10 mEq by mouth daily. (Patient not taking: Reported on 04/10/2023)   Not Taking    Social History   Socioeconomic History   Marital status: Married    Spouse name: Not on file   Number of children: Not on file   Years of education: Not on file   Highest education level: Not on file  Occupational History   Not on file  Tobacco Use   Smoking status:  Former    Years: 14    Types: Cigarettes    Quit date: 10/21/1986    Years since quitting: 36.4   Smokeless tobacco: Never  Substance and Sexual Activity   Alcohol use: No   Drug use: No   Sexual activity: Not on file  Other Topics Concern   Not on file  Social History Narrative   Not on file   Social Determinants of Health   Financial Resource Strain: Not on file  Food Insecurity: No Food Insecurity (04/11/2023)   Hunger Vital Sign    Worried About Running Out of Food in the Last Year: Never true    Ran Out of Food in the Last Year:  Never true  Transportation Needs: No Transportation Needs (04/11/2023)   PRAPARE - Administrator, Civil Service (Medical): No    Lack of Transportation (Non-Medical): No  Physical Activity: Not on file  Stress: Not on file  Social Connections: Not on file  Intimate Partner Violence: Not At Risk (04/11/2023)   Humiliation, Afraid, Rape, and Kick questionnaire    Fear of Current or Ex-Partner: No    Emotionally Abused: No    Physically Abused: No    Sexually Abused: No    Family History  Problem Relation Age of Onset   Diabetes Father    Cancer Mother    Cancer Maternal Grandfather       Intake/Output Summary (Last 24 hours) at 04/11/2023 0805 Last data filed at 04/11/2023 0616 Gross per 24 hour  Intake 583.72 ml  Output 700 ml  Net -116.28 ml     Vitals:   04/11/23 0400 04/11/23 0501 04/11/23 0615 04/11/23 0700  BP:   104/66 108/71  Pulse: 74  75 (!) 44  Resp: 16  20 19   Temp:   98.6 F (37 C) 98 F (36.7 C)  TempSrc:   Oral   SpO2:   98% 99%  Weight:  83.1 kg    Height:        PHYSICAL EXAM General: Pleasant fatigued appearing Caucasian male, well nourished, in no acute distress.  Laying at low incline in hospital bed in shared room on 2A, wife at bedside HEENT:  Normocephalic and atraumatic. Neck:  No JVD.  Lungs: Normal respiratory effort on room air. Clear bilaterally to auscultation. No wheezes, crackles, rhonchi.  Heart: Irregularly irregular with controlled rate. Normal S1 and S2 without gallops or murmurs.  Abdomen: Non-distended appearing.  Msk: Normal strength and tone for age. Extremities: Warm and well perfused. No clubbing, cyanosis.  No lower extremity edema.  Neuro: Alert and oriented X 3. Psych:  Answers questions appropriately.   Labs: Basic Metabolic Panel: Recent Labs    04/10/23 0950 04/11/23 0256  NA 140 137  K 3.9 3.6  CL 108 110  CO2 27 22  GLUCOSE 96 99  BUN 17 10  CREATININE 0.69 0.52*  CALCIUM 8.6* 8.0*  MG 2.1   --     Liver Function Tests: Recent Labs    04/10/23 0950  AST 87*  ALT 129*  ALKPHOS 81  BILITOT 1.2  PROT 6.2*  ALBUMIN 3.9    No results for input(s): "LIPASE", "AMYLASE" in the last 72 hours. CBC: Recent Labs    04/10/23 0950 04/11/23 0256  WBC 10.1 10.1  HGB 15.2 14.4  HCT 46.2 41.9  MCV 98.1 95.2  PLT 221 191    Cardiac Enzymes: Recent Labs    04/10/23 0950 04/10/23 1234  TROPONINIHS  14 11    BNP: Recent Labs    04/10/23 0950  BNP 257.8*    D-Dimer: No results for input(s): "DDIMER" in the last 72 hours. Hemoglobin A1C: No results for input(s): "HGBA1C" in the last 72 hours. Fasting Lipid Panel: No results for input(s): "CHOL", "HDL", "LDLCALC", "TRIG", "CHOLHDL", "LDLDIRECT" in the last 72 hours. Thyroid Function Tests: Recent Labs    04/10/23 0950  TSH 2.941    Anemia Panel: No results for input(s): "VITAMINB12", "FOLATE", "FERRITIN", "TIBC", "IRON", "RETICCTPCT" in the last 72 hours.   Radiology: CT Head Wo Contrast  Result Date: 04/10/2023 CLINICAL DATA:  Headache EXAM: CT HEAD WITHOUT CONTRAST TECHNIQUE: Contiguous axial images were obtained from the base of the skull through the vertex without intravenous contrast. RADIATION DOSE REDUCTION: This exam was performed according to the departmental dose-optimization program which includes automated exposure control, adjustment of the mA and/or kV according to patient size and/or use of iterative reconstruction technique. COMPARISON:  None Available. FINDINGS: Brain: No evidence of acute infarction, hemorrhage, hydrocephalus, extra-axial collection or mass lesion/mass effect. Vascular: No hyperdense vessel or unexpected calcification. Skull: Normal. Negative for fracture or focal lesion. Sinuses/Orbits: No acute finding. Other: None. IMPRESSION: No acute intracranial pathology. Electronically Signed   By: Larose Hires D.O.   On: 04/10/2023 11:48   DG Chest 2 View  Result Date: 04/10/2023 CLINICAL  DATA:  Chest pain EXAM: CHEST - 2 VIEW COMPARISON:  X-ray 02/12/2006 FINDINGS: Calcified aorta. No consolidation, pneumothorax or effusion. No edema. Normal cardiopericardial silhouette. Mild degenerative changes along the spine IMPRESSION: No acute cardiopulmonary disease Electronically Signed   By: Karen Kays M.D.   On: 04/10/2023 10:26    ECHO 11/27/2021 DOPPLER ECHO and OTHER SPECIAL PROCEDURES ------------------------------------     Aortic: TRIVIAL AR             No AS      Mitral: MILD MR                No MS     MV Inflow E Vel.= 54.0 cm/s  MV Annulus E'Vel.= 8.0 cm/s  E/E'Ratio= 7   Tricuspid: TRIVIAL TR             No TS             2.4 m/s peak TR vel   39 mmHg peak RV pressure   Pulmonary: TRIVIAL PR             No PS       Other:             DEFINITY CONTRAST SHOWS ENHANCED LV BORDERS   INTERPRETATION ---------------------------------------------------------------    MILD LV DYSFUNCTION (See above) WITH MILD LVH    NORMAL LA PRESSURES WITH DIASTOLIC DYSFUNCTION    NORMAL RIGHT VENTRICULAR SYSTOLIC FUNCTION    VALVULAR REGURGITATION: TRIVIAL AR, MILD MR, TRIVIAL PR, TRIVIAL TR    NO VALVULAR STENOSIS    EF 45-50%    NO PRIOR STUDY FOR COMPARISON    (Report version 3.0)                    Interpreted and Electronically signed   Perform. by: Graciela Husbands, RDCS                     by: Blaine Hamper, MD   TELEMETRY reviewed by me (LT) 04/11/2023 : AF rate 70s-90s with short periods in the 110s-120s  EKG reviewed by me: AF  controlled V response rate 82  Data reviewed by me (LT) 04/11/2023: admission H&P  last 24h vitals tele labs imaging I/O    Principal Problem:   Atrial fibrillation with RVR (HCC) Active Problems:   Hypertension   Hyperlipidemia   Chronic systolic CHF (congestive heart failure) (HCC)   Obesity (BMI 30-39.9)   Abnormal LFTs    ASSESSMENT AND PLAN:  Vincent Sanchez is a 62yoM with a PMH of persistent AF s/p successful catheter  ablation (10/2018), 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  Found to be in AF RVR when EMS arrived with rate up to 170bpm. Cardiology is consulted for further assistance with his atrial fibrillation.  # paroxysmal AF RVR  # hx successful AF ablation (10/2018) Clear symptom onset around 10:45 PM on 6/19 of lightheadedness, generalized malaise, and some discomfort that radiates to his throat similar to years ago when he was in atrial fibrillation.  With EMS rates were as high as 170s, better rate control this AM in the 70s-90s - remains in AF today. TSH within normal limits, no marked electrolyte disturbances or recent illness as a clear cause of his recurrent A-fib. -Continue IV amiodarone at 30 mg/h until DCCV, then change to PO amiodarone 400mg  BID x 7 days, then 200mg  daily thereafter - start metoprolol tartrate 12.5mg  BID  -Continue IV heparin, likely switch to Eliquis 5 mg twice daily for a period of time (1 month?) following DCCV. CHADSVASC 0 (off antihypertensives since marked weight loss)  - Since he remains in AF today and symptom onset was <48 hours ago with low CHADSVASC, will plan for DCCV today at 12:30 PM with Dr. Dorothyann Peng. Discussed the risks and benefits of this procedure with the patient and his wife and they are in agreement to proceed.   # chronic HFmrEF (45-50%)  Euvolemic on exam, will repeat echo complete.  This patient's plan of care was discussed and created with Dr. Juliann Pares and he is in agreement.  Signed: Rebeca Allegra , PA-C 04/11/2023, 8:05 AM [facility]

## 2023-04-11 NOTE — TOC Benefit Eligibility Note (Signed)
Pharmacy Patient Advocate Encounter  Insurance verification completed.    The patient is insured through [employer]/ [employer]    Ran test claim for Eliquis and the current 30 day co-pay is $30.00.  Ran test claim for Xarelto and the current 30 day co-pay is $30.00.  This test claim was processed through [facility]- copay amounts may vary at other pharmacies due to pharmacy/plan contracts, or as the patient moves through the different stages of their insurance plan.

## 2023-04-11 NOTE — Progress Notes (Signed)
Transition of Care [facility]) - Inpatient Brief Assessment   Patient Details  Name: Vincent Sanchez MRN: [id number] Date of Birth: 1961-04-24  Transition of Care [facility]) CM/SW Contact:    Truddie Hidden, RN Phone Number: 04/11/2023, 10:29 AM   Clinical Narrative: TOC assessing for ongoing needs and discharge planning.   Transition of Care Asessment: Insurance and Status: Insurance coverage has been reviewed Patient has primary care physician: Yes Home environment has been reviewed: Return to previous environment Prior level of function:: Independent Prior/Current Home Services: No current home services Social Determinants of Health Reivew: SDOH reviewed no interventions necessary Readmission risk has been reviewed: Yes Transition of care needs: no transition of care needs at this time

## 2023-04-20 NOTE — CV Procedure (Addendum)
Electrical Cardioversion Procedure Note   Procedure: Electrical Cardioversion Indications:  Atrial Fibrillation  Procedure Details Consent: Risks of procedure as well as the alternatives and risks of each were explained to the (patient/caregiver).  Consent for procedure obtained. Time Out: Verified patient identification, verified procedure, site/side was marked, verified correct patient position, special equipment/implants available, medications/allergies/relevent history reviewed, required imaging and test results available.  Performed  Patient placed on cardiac monitor, pulse oximetry, supplemental oxygen as necessary.  Sedation given:  Propofol as per anesthesia Pacer pads placed anterior and posterior chest.  Cardioverted 1 time(s).  Cardioverted at 120J.  Evaluation Findings: Post procedure EKG shows: NSR Complications: None Patient did tolerate procedure well.   Dorothyann Peng MD 04/11/23 1330

## 2024-04-19 NOTE — Progress Notes (Signed)
 Established Patient Visit   Chief Complaint: Chief Complaint  Patient presents with  . Follow-up    Follow up post cardioversion.     Date of Service: 04/28/2024 Date of Birth: [birth year] PCP: Johnson Morna FALCON, NP  History of Present Illness: Vincent Sanchez is a 63 y.o.male patient who presents for a 1 year follow up. PMH significant for a-fib, hypertension, OSA on CPAP, hyperlipidemia, obesity.  Today, pt presents with recent a-fib activity. History of cardioversion and ablation. Consider having another ablation. Has noticed having hand tremors and some headaches that may be related to amiodarone . Will lower amiodarone . Order 72 hr holter for further assessment of a-fib. Refer to Dr Ezzard EP for possible ablation.  Patient is a recurrence of atrial fibrillation, on anticoagulation amiodarone  post cardioversion   Visit Summaries: 04/22/2023 Patient was seen by me for a follow up. Recommend cardiogram. Past Medical and Surgical History  Past Medical History Past Medical History:  Diagnosis Date  . Atrial fibrillation (CMS/HHS-HCC)   . Cholelithiasis   . Diabetes mellitus without complication (CMS/HHS-HCC)   . Hepatitis   . Hyperlipidemia   . Hypertension   . Sleep apnea    CPAP (AHI 51.1 in 2020)    Past Surgical History He has a past surgical history that includes DCCV; Cholecystectomy; Colonoscopy; pr removal gallbladder (2012); Ablation Arrythmia Focus (10/2018); egd (N/A, 11/28/2020); lap single anastomosis duodenal-ileal bypass sleeve gastrectomy  (N/A, 04/12/2021); egd (N/A, 04/12/2021); and laparoscopic repair paraesophageal hiatal hernia (N/A, 04/12/2021).   Medications and Allergies  Current Medications  Current Outpatient Medications  Medication Sig Dispense Refill  . AMIOdarone  (PACERONE ) 200 MG tablet Take 400 mg by mouth 2 (two) times daily    . apixaban  (ELIQUIS ) 5 mg tablet Take 1 tablet (5 mg total) by mouth 2 (two) times daily 180 tablet 3  . calcium carb/vitamin  D3/vit K1 (VIACTIV ORAL) Take 3 tablets by mouth once daily Calcium chews    . cholecalciferol, vitamin D3, (VITAMIN D3 ORAL) Take 3 tablets by mouth    . folic acid  (FOLVITE ) 1 MG tablet Take 1 tablet (1 mg total) by mouth once daily 30 tablet 11  . multivitamin tablet Take 1 tablet by mouth once daily START TAKING YOUR BARIATRIC MULTIVITAMIN    . phytonadione, vit K1, (VITAMIN K) 100 mcg tablet Take 100 mcg by mouth once daily    . psyllium husk (DAILY FIBER) 0.4 gram Cap Take by mouth    . ZINC ORAL Take by mouth once daily    . glucosam su dip-chondroit-C-Mn 500-400-66-3 mg Cap Take 2 capsules by mouth once daily (Patient not taking: Reported on 07/31/2023)     No current facility-administered medications for this visit.    Allergies: Patient has no known allergies.  Social and Family History  Social History  reports that he quit smoking about 37 years ago. His smoking use included cigarettes. He started smoking about 49 years ago. He has a 12 pack-year smoking history. He has never used smokeless tobacco. He reports that he does not currently use alcohol. He reports that he does not use drugs.  Family History Family History  Problem Relation Name Age of Onset  . Parkinsonism Mother    . Diabetes type II Father    . Coronary Artery Disease (Blocked arteries around heart) Father    . Myocardial Infarction (Heart attack) Father    . Coronary Artery Disease (Blocked arteries around heart) Sister    . Anesthesia problems Neg Hx    .  Malignant hypertension Neg Hx      Review of Systems   Pertinent positives and negatives are mentioned above in HPI and all other systems are negative.  Physical Examination   Vitals:BP (!) 142/82 (BP Location: Left upper arm, Patient Position: Sitting, BP Cuff Size: Adult)   Pulse (!) 48   Ht 188 cm (6' 2)   Wt 97.3 kg (214 lb 9.6 oz)   SpO2 98%   BMI 27.55 kg/m  Ht:188 cm (6' 2) Wt:97.3 kg (214 lb 9.6 oz) ADJ:Anib surface area is 2.25 meters  squared. Body mass index is 27.55 kg/m.  HEENT: Pupils equally reactive to light and accomodation  Neck: Supple without thyromegaly, carotid pulses 2+ Lungs: clear to auscultation bilaterally; no wheezes, rales, rhonchi Heart: Regular rate and rhythm.  No gallops, murmurs or rub Abdomen: soft nontender, nondistended, with normal bowel sounds Extremities: no cyanosis, clubbing, or edema Peripheral Pulses: 2+ in all extremities, 2+ femoral pulses bilaterally Neurologic: Alert and oriented X3; speech intact; face symmetrical; moves all extremities well  Cardiovascular Studies:    Echocardiogram 2D complete: 01/20/2024 CONCLUSION ------------------------------------------------------------------------------- NORMAL LEFT VENTRICULAR SYSTOLIC FUNCTION WITH NO LVH ESTIMATED EF: 55%, CALC EF(2D): 55% NORMAL LA PRESSURES WITH DIASTOLIC DYSFUNCTION (GRADE 1) NORMAL RIGHT VENTRICULAR SYSTOLIC FUNCTION VALVULAR REGURGITATION: TRIVIAL AR, MILD MR, TRIVIAL PR, MILD TR NO VALVULAR STENOSIS  NM Myocardial Perfusion SPECT multiple (stress and rest): 01/23/2018 Narrative  This result has an attachment that is not available.   Blood pressure demonstrated a blunted response to exercise.  There was no ST segment deviation noted during stress.  No T wave inversion was noted during stress.  This is a low risk study.  The left ventricular ejection fraction is mildly decreased (45-54%).  Nuclear stress EF: 50%.  Conclusion Low risk Myoview  with borderline reduced left ventricular function possibly related to A. Fib No clear evidence of ischemia   Cardiac Catheterization:   Holter:  Cardiac CT Scan:  Cardiac MRI:   Assessment   63 y.o. male with  1. PAF (paroxysmal atrial fibrillation) (CMS/HHS-HCC)   2. Asymptomatic LV dysfunction   3. Chronic systolic HF (heart failure) (CMS/HHS-HCC)   4. Primary hypertension   5. Hyperlipidemia, unspecified hyperlipidemia type   6. OSA on  CPAP   7. Morbid obesity with BMI of 45.0-49.9, adult (CMS/HHS-HCC)   8. S/P single anastomosis duodenal switch (SADI-S/SIPS)    Plan   PAF, s/p cardioversion and ablation, recent activity, continue eliquis , lower amiodarone , refer to [facility] for possible ablation, order 72 hr holter for further assessment Hypertension, today's BP was 142/82, Hyperlipidemia, chronic, stable, recommend statin therapy if needed, diet and exercise   OSA, continue CPAP use, recommend weight loss Obesity, recommend weight loss, exercise, and portion control Status post ablation for paroxysmal atrial fibrillation back on amiodarone  anticoagulation consider repeat ablation    No follow-ups on file.  This note is partially written by Leita Ellen, in the presence of and acting as the scribe of Dr. Cara Lovelace.      Leita Ellen  I have reviewed, edited and added to the note to reflect my best personal medical judgment.  Attestation Statement:   I personally performed the service. (TP)  DWAYNE JONETTA LOVELACE, MD  [facility] [place], [place] Ph:  [phone] Fax:  [phone] This note was generated in part with voice recognition software, Dragon.  I apologize for any typographical errors that were not detected and corrected from this process.  They are unintentional.

## 2024-04-21 ENCOUNTER — Emergency Department
Admission: EM | Admit: 2024-04-21 | Discharge: 2024-04-21 | Disposition: A | Discharge status: Home / Self Care | Attending: Emergency Medicine | Admitting: Emergency Medicine

## 2024-04-21 ENCOUNTER — Other Ambulatory Visit: Payer: Self-pay

## 2024-04-21 ENCOUNTER — Encounter: Payer: Self-pay | Admitting: Emergency Medicine

## 2024-04-21 DIAGNOSIS — Z7901 Long term (current) use of anticoagulants: Secondary | ICD-10-CM | POA: Insufficient documentation

## 2024-04-21 DIAGNOSIS — R002 Palpitations: Secondary | ICD-10-CM | POA: Diagnosis present

## 2024-04-21 DIAGNOSIS — I4891 Unspecified atrial fibrillation: Secondary | ICD-10-CM | POA: Insufficient documentation

## 2024-04-21 LAB — BASIC METABOLIC PANEL WITH GFR
Anion gap: 12 (ref 5–15)
BUN: 12 mg/dL (ref 8–23)
CO2: 20 mmol/L — ABNORMAL LOW (ref 22–32)
Calcium: 9 mg/dL (ref 8.9–10.3)
Chloride: 110 mmol/L (ref 98–111)
Creatinine, Ser: 0.52 mg/dL — ABNORMAL LOW (ref 0.61–1.24)
GFR, Estimated: >60 mL/min (ref >60)
Glucose, Bld: 96 mg/dL (ref 70–99)
Potassium: 3.5 mmol/L (ref 3.5–5.1)
Sodium: 142 mmol/L (ref 135–145)

## 2024-04-21 LAB — CBC WITH DIFFERENTIAL/PLATELET
Abs Immature Granulocytes: 0.03 10*3/uL (ref 0.00–0.07)
Basophils Absolute: 0.1 10*3/uL (ref 0.0–0.1)
Basophils Relative: 1 %
Eosinophils Absolute: 0.2 10*3/uL (ref 0.0–0.5)
Eosinophils Relative: 2 %
HCT: 44.5 % (ref 39.0–52.0)
Hemoglobin: 15.4 g/dL (ref 13.0–17.0)
Immature Granulocytes: 0 %
Lymphocytes Relative: 19 %
Lymphs Abs: 1.9 10*3/uL (ref 0.7–4.0)
MCH: 31.6 pg (ref 26.0–34.0)
MCHC: 34.6 g/dL (ref 30.0–36.0)
MCV: 91.4 fL (ref 80.0–100.0)
Monocytes Absolute: 1 10*3/uL (ref 0.1–1.0)
Monocytes Relative: 9 %
Neutro Abs: 7.1 10*3/uL (ref 1.7–7.7)
Neutrophils Relative %: 69 %
Platelets: 229 10*3/uL (ref 150–400)
RBC: 4.87 MIL/uL (ref 4.22–5.81)
RDW: 13 % (ref 11.5–15.5)
WBC: 10.2 10*3/uL (ref 4.0–10.5)
nRBC: 0 % (ref 0.0–0.2)

## 2024-04-21 LAB — MAGNESIUM: Magnesium: 2.2 mg/dL (ref 1.7–2.4)

## 2024-04-21 MED ORDER — FENTANYL CITRATE PF 50 MCG/ML IJ SOSY
50.0000 ug | PREFILLED_SYRINGE | Freq: Once | INTRAMUSCULAR | Status: AC
  Administered 2024-04-21: 50 ug via INTRAVENOUS
  Filled 2024-04-21: qty 1

## 2024-04-21 MED ORDER — AMIODARONE IV BOLUS ONLY 150 MG/100ML
150.0000 mg | Freq: Once | INTRAVENOUS | Status: AC
  Administered 2024-04-21: 150 mg via INTRAVENOUS
  Filled 2024-04-21: qty 100

## 2024-04-21 MED ORDER — AMIODARONE HCL 400 MG PO TABS: 400.0000 mg | ORAL_TABLET | Freq: Two times a day (BID) | ORAL | 0 refills | Status: AC

## 2024-04-21 MED ORDER — ETOMIDATE 2 MG/ML IV SOLN
0.1000 mg/kg | Freq: Once | INTRAVENOUS | Status: AC
  Administered 2024-04-21: 10 mg via INTRAVENOUS
  Filled 2024-04-21: qty 10

## 2024-04-21 NOTE — ED Provider Notes (Signed)
 [facility] Provider Note    Event Date/Time   First MD Initiated Contact with Patient 04/21/24 1647     (approximate)   History   Palpitations   HPI  Vincent Sanchez is a 63 y.o. male who presents to the emergency department today with concerns for palpitations and some shortness of breath.  Symptoms started about 2 hours prior to my evaluation.  He had been feeling in his normal state of health earlier in the day.  Denies any unusual exertion or activity.  Does have a history of A-fib and this reminded him of when he has been in A-fib.  Last time he was in A-fib was about a year ago in the was electrically cardioverted out of it.  Has not had any episodes since then.  Is no longer on a rhythm controlling agent.  Has been taking his Eliquis  and has not missed any doses recently.     Physical Exam   Triage Vital Signs: ED Triage Vitals  Encounter Vitals Group     BP 04/21/24 1646 126/81     Girls Systolic BP Percentile --      Girls Diastolic BP Percentile --      Boys Systolic BP Percentile --      Boys Diastolic BP Percentile --      Pulse Rate 04/21/24 1646 79     Resp 04/21/24 1646 17     Temp 04/21/24 1650 98.2 F (36.8 C)     Temp Source 04/21/24 1650 Oral     SpO2 04/21/24 1646 100 %     Weight 04/21/24 1647 207 lb (93.9 kg)     Height 04/21/24 1647 6' 2 (1.88 m)     Head Circumference --      Peak Flow --      Pain Score 04/21/24 1646 0     Pain Loc --      Pain Education --      Exclude from Growth Chart --     Most recent vital signs: Vitals:   04/21/24 1646 04/21/24 1650  BP: 126/81   Pulse: 79   Resp: 17   Temp:  98.2 F (36.8 C)  SpO2: 100%    General: Awake, alert, oriented. CV:  Good peripheral perfusion. Tachycardia, irregular rhythm. Resp:  Normal effort. Lungs clear. Abd:  No distention.    ED Results / Procedures / Treatments   Labs (all labs ordered are listed, but only abnormal results are  displayed) Labs Reviewed  BASIC METABOLIC PANEL WITH GFR - Abnormal; Notable for the following components:      Result Value   CO2 20 (*)    Creatinine, Ser 0.52 (*)    All other components within normal limits  CBC WITH DIFFERENTIAL/PLATELET  MAGNESIUM     EKG  I, Guadalupe Eagles, attending physician, personally viewed and interpreted this EKG  EKG Time: 1650 Rate: 141 Rhythm: atrial fibrillation Axis: left axis deviation Intervals: qtc 471 QRS: narrow ST changes: no st elevation Impression: abnormal EKG  I, Guadalupe Eagles, attending physician, personally viewed and interpreted this EKG  EKG Time: 1911 Rate: 49 Rhythm: sinus bradycardia Axis: normal Intervals: qtc 378 QRS: narrow ST changes: no st elevation Impression: abnormal ekg  RADIOLOGY None   PROCEDURES:  Critical Care performed: Yes  CRITICAL CARE Performed by: Guadalupe Eagles   Total critical care time: 30 minutes  Critical care time was exclusive of separately billable procedures and treating other patients.  Critical care was necessary to treat or prevent imminent or life-threatening deterioration.  Critical care was time spent personally by me on the following activities: development of treatment plan with patient and/or surrogate as well as nursing, discussions with consultants, evaluation of patient's response to treatment, examination of patient, obtaining history from patient or surrogate, ordering and performing treatments and interventions, ordering and review of laboratory studies, ordering and review of radiographic studies, pulse oximetry and re-evaluation of patient's condition.   .Cardioversion  Date/Time: 04/21/2024 8:17 PM  Performed by: Floy Roberts, MD Authorized by: Floy Roberts, MD   Consent:    Consent obtained:  Written   Consent given by:  Patient   Risks discussed:  Induced arrhythmia and pain   Alternatives discussed:  Rate-control medication Universal  protocol:    Patient identity confirmed:  Verbally with patient Attempt one:    Cardioversion mode:  Synchronous   Shock (Joules):  120   Shock outcome:  Conversion to normal sinus rhythm Post-procedure details:    Patient status:  Alert .Sedation  Date/Time: 04/21/2024 8:18 PM  Performed by: Floy Roberts, MD Authorized by: Floy Roberts, MD   Consent:    Consent obtained:  Written   Consent given by:  Patient   Risks discussed:  Dysrhythmia, prolonged sedation necessitating reversal and respiratory compromise necessitating ventilatory assistance and intubation Universal protocol:    Immediately prior to procedure, a time out was called: yes   Pre-sedation assessment:    Time since last food or drink:  Unknown   NPO status caution: urgency dictates proceeding with non-ideal NPO status     ASA classification: class 2 - patient with mild systemic disease     Mallampati score:  II - soft palate, uvula, fauces visible   Pre-sedation assessments completed and reviewed: airway patency, cardiovascular function, hydration status and mental status   A pre-sedation assessment was completed prior to the start of the procedure Procedure details (see MAR for exact dosages):    Sedation:  Etomidate   Intended level of sedation: deep   Total Provider sedation time (minutes):  20 Post-procedure details:   A post-sedation assessment was completed following the completion of the procedure.   Procedure completion:  Tolerated well, no immediate complications     MEDICATIONS ORDERED IN ED: Medications - No data to display   IMPRESSION / MDM / ASSESSMENT AND PLAN / ED COURSE  I reviewed the triage vital signs and the nursing notes.                              Differential diagnosis includes, but is not limited to, electrolyte abnormality, cardiac disease  Patient's presentation is most consistent with acute presentation with potential threat to life or bodily function.   The  patient is on the cardiac monitor to evaluate for evidence of arrhythmia and/or significant heart rate changes.  Patient presented to the emergency department today because of concerns for palpitations and atrial fibrillation.  Patient has history of atrial fibrillation.  Is currently on Eliquis  and has not missed any doses.  EKG here is consistent with A-fib with RVR.  Patient was initially given amiodarone  without any change in rhythm.  I discussed electrical cardioversion with patient and he did want to try it.  Cardioversion was successful.  Will start patient on amiodarone .  Patient has appointment already scheduled with cardiology in 1 week.      FINAL CLINICAL IMPRESSION(S) / ED  DIAGNOSES   Final diagnoses:  Atrial fibrillation, unspecified type [facility])      Note:  This document was prepared using Dragon voice recognition software and may include unintentional dictation errors.    Floy Roberts, MD 04/21/24 2204

## 2024-04-21 NOTE — ED Triage Notes (Signed)
 Patient to ED via POV for palpations/ SOB. Started around 2 hrs ago. Hx of afib- previously shocked for same. On eliquis .

## 2024-04-21 NOTE — ED Notes (Signed)
 Pt shocked w/ 120J.

## 2024-07-13 NOTE — Progress Notes (Signed)
 Established Patient Visit   Chief Complaint: Chief Complaint  Patient presents with  . Follow-up    Follow up from PVI with Dr. Ezzard   Date of Service: 07/19/2024 Date of Birth: 12-13-1960 PCP: Johnson Morna FALCON, NP  History of Present Illness: Vincent Sanchez is a 63 y.o.male patient who presents for a 3 month follow up. PMH significant for a-fib, hypertension, OSA on CPAP, hyperlipidemia, obesity.  Today, pt presents after recent ablation. Amiodarone  was stopped after ablation. Patient states that he is doing well. Denies having any recent chest pain, SOB. Encouraged to regularly exercise. No longer has sleep apnea. Order calcium score for further assessment.    Visit Summaries: 04/28/2024 Patient was seen by me for a follow up. Will lower amiodarone . Order 72 hr holter for further assessment of a-fib. Refer to Dr Ezzard EP for possible ablation.   Past Medical and Surgical History  Past Medical History Past Medical History:  Diagnosis Date  . Atrial fibrillation (CMS/HHS-HCC)   . Cholelithiasis   . Diabetes mellitus without complication (CMS/HHS-HCC)   . Hepatitis   . Hyperlipidemia   . Hypertension   . Sleep apnea    CPAP (AHI 51.1 in 2020)    Past Surgical History He has a past surgical history that includes DCCV; Cholecystectomy; Colonoscopy; pr removal gallbladder (2012); Ablation Arrythmia Focus (10/2018); egd (N/A, 11/28/2020); lap single anastomosis duodenal-ileal bypass sleeve gastrectomy  (N/A, 04/12/2021); egd (N/A, 04/12/2021); and laparoscopic repair paraesophageal hiatal hernia (N/A, 04/12/2021).   Medications and Allergies  Current Medications  Current Outpatient Medications  Medication Sig Dispense Refill  . apixaban  (ELIQUIS ) 5 mg tablet Take 1 tablet (5 mg total) by mouth 2 (two) times daily 180 tablet 3  . calcium carbonate (CALCIUM 600 ORAL) Take by mouth Chews    . multivitamin tablet Take 1 tablet by mouth once daily START TAKING YOUR BARIATRIC MULTIVITAMIN     . phytonadione, vit K1, (VITAMIN K) 100 mcg tablet Take 100 mcg by mouth once daily    . ZINC ORAL Take by mouth once daily    . folic acid  (FOLVITE ) 1 MG tablet Take 1 tablet (1 mg total) by mouth once daily (Patient not taking: Reported on 07/19/2024) 30 tablet 11   No current facility-administered medications for this visit.    Allergies: Patient has no known allergies.  Social and Family History  Social History  reports that he quit smoking about 37 years ago. His smoking use included cigarettes. He started smoking about 49 years ago. He has a 12 pack-year smoking history. He has never used smokeless tobacco. He reports that he does not currently use alcohol. He reports that he does not use drugs.  Family History Family History  Problem Relation Name Age of Onset  . Parkinsonism Mother    . Diabetes type II Father    . Coronary Artery Disease (Blocked arteries around heart) Father    . Myocardial Infarction (Heart attack) Father    . Coronary Artery Disease (Blocked arteries around heart) Sister    . Anesthesia problems Neg Hx    . Malignant hypertension Neg Hx      Review of Systems   Pertinent positives and negatives are mentioned above in HPI and all other systems are negative.  Physical Examination   Vitals:BP 124/72   Pulse 52   Ht 188 cm (6' 2)   Wt 92.6 kg (204 lb 3.2 oz)   SpO2 98%   BMI 26.22 kg/m  Ht:188 cm (6' 2)  Wt:92.6 kg (204 lb 3.2 oz) ADJ:Anib surface area is 2.2 meters squared. Body mass index is 26.22 kg/m.  HEENT: Pupils equally reactive to light and accomodation  Neck: Supple without thyromegaly, carotid pulses 2+ Lungs: clear to auscultation bilaterally; no wheezes, rales, rhonchi Heart: Regular rate and rhythm.  No gallops, murmurs or rub Abdomen: soft nontender, nondistended, with normal bowel sounds Extremities: no cyanosis, clubbing, or edema Peripheral Pulses: 2+ in all extremities, 2+ femoral pulses bilaterally Neurologic: Alert and  oriented X3; speech intact; face symmetrical; moves all extremities well  Cardiovascular Studies:    Echocardiogram 2D complete: 01/20/2024 CONCLUSION ------------------------------------------------------------------------------- NORMAL LEFT VENTRICULAR SYSTOLIC FUNCTION WITH NO LVH ESTIMATED EF: 55%, CALC EF(2D): 55% NORMAL LA PRESSURES WITH DIASTOLIC DYSFUNCTION (GRADE 1) NORMAL RIGHT VENTRICULAR SYSTOLIC FUNCTION VALVULAR REGURGITATION: TRIVIAL AR, MILD MR, TRIVIAL PR, MILD TR NO VALVULAR STENOSIS  NM Myocardial Perfusion SPECT multiple (stress and rest): 01/23/2018 Narrative This result has an attachment that is not available.   Blood pressure demonstrated a blunted response to exercise.  There was no ST segment deviation noted during stress.  No T wave inversion was noted during stress.  This is a low risk study.  The left ventricular ejection fraction is mildly decreased (45-54%).  Nuclear stress EF: 50%.   Cardiac Catheterization:  06/25/2024 Scan on file  Holter:  Cardiac CT Scan:  Cardiac MRI:   Assessment   63 y.o. male with  1. Paroxysmal atrial fibrillation (CMS/HHS-HCC)   2. Status post ablation of atrial fibrillation   3. Atrial fibrillation status post cardioversion (CMS/HHS-HCC)   4. Chronic anticoagulation   5. Ischemic cardiomyopathy   6. Chronic systolic HF (heart failure) (CMS/HHS-HCC)   7. Primary hypertension   8. Hyperlipidemia, unspecified hyperlipidemia type   9. Morbid obesity with BMI of 45.0-49.9, adult (CMS/HHS-HCC)    Plan   PAF, s/p ablation, s/p cardioversion, continue eliquis , stopped amiodarone  Cardiomyopathy, order CTA with FFR for further assessment  Hypertension, today's BP was 124/72, reasonably controlled  Hyperlipidemia, consider statin therapy for lipid management if needed Obesity, recommend weight loss, exercise, and portion control    Return in about 6 months (around 01/16/2025).  This note is partially  written by Leita Ellen, in the presence of and acting as the scribe of Dr. [name].      Leita Ellen  I have reviewed, edited and added to the note to reflect my best personal medical judgment.  Attestation Statement:   I personally performed the service. (TP)  Vincent JONETTA LOVELACE, MD  [facility] [place], [place] Ph:  [phone] Fax:  [phone] This note was generated in part with voice recognition software, Dragon.  I apologize for any typographical errors that were not detected and corrected from this process.  They are unintentional.

## 2024-07-19 ENCOUNTER — Other Ambulatory Visit: Payer: Self-pay | Admitting: Internal Medicine

## 2024-07-19 DIAGNOSIS — I5022 Chronic systolic (congestive) heart failure: Secondary | ICD-10-CM

## 2024-07-19 DIAGNOSIS — E785 Hyperlipidemia, unspecified: Secondary | ICD-10-CM

## 2024-07-19 DIAGNOSIS — I480 Paroxysmal atrial fibrillation: Secondary | ICD-10-CM

## 2024-07-19 DIAGNOSIS — I255 Ischemic cardiomyopathy: Secondary | ICD-10-CM

## 2024-07-22 NOTE — Progress Notes (Signed)
 This video encounter was conducted with the patient's (or proxy's) verbal consent via secure, interactive audio and video telecommunications while in clinic/office/hospital.  The patient (or proxy) was instructed to have this encounter in a suitably private space and to only have persons present to whom they give permission to participate. In addition, patient identity was confirmed by use of name plus an additional identifier.  2020-08-05 E&M) This visit was coded based on time. I spent a total of 30 minutes in both face-to-face and non-face-to-face activities for this visit on the date of this encounter.  BARIATRIC SURGERY LONG-TERM POSTOP VISIT (Video)  CHIEF COMPLAINT: Postsurgical malabsorption, hx of morbid obesity, postoperative surveillance s/p SADI-S  HISTORY OF PRESENT ILLNESS: Patient is s/p single anastomosis duodenal switch (SADI-S) with Dr. Tomi on 04/12/21. Preop weight was 333 lbs (BMI 43).  He was seen by bariatric RD for annual follow-up on 06/01/24.    TODAY: Current weight is 197-207 lbs.  He reports that he feels really good at this weight and is happy here. Diet: -Discussed in detail with bariatric RD on 06/01/24. -He is tolerating stage 4 diet without difficulty. He is eating 3 small meals spaced with several small snacks evenly throughout the day, focusing on high protein sources.  He continues to supplement with protein shakes throughout the day. -24-hour recall: (B) 2 pieces of cheese toast; (L) [place], ham, cheese sandiwch; (D) baked spaghetti.  Snacks - protein bar x2, donut, popcorn.  Hydration: -He reports well over 64 oz of hydrating fluids daily, drinking Twist lemonade and SF tea.  Exercise: -He is very active.  He is currently building an apartment for his son who was in a motorcycle accident earlier this year.    Bowel habits: -No issues.  Reports around 2 BM per day--one in AM, one in afternoon.  Vitamins + supplementation: -Barimelts MV w/ iron once  daily -Viactiv calcium chews - 2 per day -Vitamin K 100 mcg daily (started March 2024)  -Folic acid  1 mg daily -Zinc 50 mcg daily  Labs: -Comprehensive bariatric labs were ordered today and will be completed after today's visit.  Orders placed; patient aware.  Acid reflux: -None.  Other: -Denies nausea, vomiting, abd pain. -Reports recurrent a-fib, underwent ablation a couple of weeks ago.  Notes that energy was down for a bit, but he's doing better.  He has been doing really well since then.  Does report that now taking Eliquis .   CURRENT MEDICAL CONDITIONS: Patient Active Problem List  Diagnosis  . Arthritis  . Gallbladder stone with nonacute cholecystitis  . Hyperlipidemia  . Hypertension  . PAF (paroxysmal atrial fibrillation) (CMS/HHS-HCC)  . Chronic anticoagulation  . Status post ablation of atrial fibrillation  . Chronic gout involving toe of right foot without tophus  . OSA on CPAP  . Morbid obesity with BMI of 45.0-49.9, adult (CMS-HCC)  . Cardiac arrhythmia  . S/P single anastomosis duodenal switch (SADI-S/SIPS)  . Mild protein-calorie malnutrition (HHS-HCC)  . Chronic systolic CHF (congestive heart failure) (CMS/HHS-HCC)  . Abnormal LFTs     MEDICATIONS: Current Outpatient Medications  Medication Sig Dispense Refill  . apixaban  (ELIQUIS ) 5 mg tablet Take 1 tablet (5 mg total) by mouth 2 (two) times daily 180 tablet 3  . calcium carbonate (CALCIUM 600 ORAL) Take by mouth Chews    . folic acid  (FOLVITE ) 1 MG tablet Take 1 tablet (1 mg total) by mouth once daily (Patient not taking: Reported on 07/19/2024) 30 tablet 11  . multivitamin tablet  Take 1 tablet by mouth once daily START TAKING YOUR BARIATRIC MULTIVITAMIN    . phytonadione, vit K1, (VITAMIN K) 100 mcg tablet Take 100 mcg by mouth once daily    . ZINC ORAL Take by mouth once daily     No current facility-administered medications for this visit.    PAST MEDICAL HISTORY:  Past Medical History:   Diagnosis Date  . Atrial fibrillation (CMS/HHS-HCC)   . Cholelithiasis   . Diabetes mellitus without complication (CMS/HHS-HCC)   . Hepatitis   . Hyperlipidemia   . Hypertension   . Sleep apnea    CPAP (AHI 51.1 in 2020)    PAST SURGICAL HISTORY:  Past Surgical History:  Procedure Laterality Date  . PR REMOVAL GALLBLADDER  2012  . ABLATION ARRYTHMIA FOCUS  10/2018   Cardiac cath with ablation for a-fib treatment  . EGD N/A 11/28/2020   Procedure: EGD, FLEXIBLE, TRANSORAL; DIAGNOSTIC;  Surgeon: Portenier, Lonell Craze, MD;  Location: [facility] OR;  Service: General Surgery;  Laterality: N/A;  . LAP SINGLE ANASTOMOSIS DUODENAL-ILEAL BYPASS SLEEVE GASTRECTOMY  N/A 04/12/2021   Procedure: LAPAROSCOPIC SINGLE ANASTOMOSIS DUODENAL-ILEAL BYPASS WITH SLEEVE GASTRECTOMY (SADI-S);  Surgeon: Portenier, Lonell Craze, MD;  Location: [facility] OR;  Service: General Surgery;  Laterality: N/A;  . EGD N/A 04/12/2021   Procedure: EGD;  Surgeon: Portenier, Lonell Craze, MD;  Location: [facility] OR;  Service: General Surgery;  Laterality: N/A;  . LAPAROSCOPIC REPAIR PARAESOPHAGEAL HIATAL HERNIA N/A 04/12/2021   Procedure: LAPAROSCOPY, SURGICAL; REPAIR PARAESOPHAGEAL HIATAL HERNIA;  Surgeon: Portenier, Lonell Craze, MD;  Location: [facility] OR;  Service: General Surgery;  Laterality: N/A;  . CHOLECYSTECTOMY    . COLONOSCOPY    . DCCV     twice    FAMILY HISTORY:  Family History  Problem Relation Name Age of Onset  . Parkinsonism Mother    . Diabetes type II Father    . Coronary Artery Disease (Blocked arteries around heart) Father    . Myocardial Infarction (Heart attack) Father    . Coronary Artery Disease (Blocked arteries around heart) Sister    . Anesthesia problems Neg Hx    . Malignant hypertension Neg Hx      SOCIAL HISTORY:  Social History   Socioeconomic History  . Marital status: Married  Occupational History  . Occupation: retired    Comment: from [employer]  . Occupation: Scientist, forensic  Tobacco  Use  . Smoking status: Former    Current packs/day: 0.00    Average packs/day: 1 pack/day for 12.0 years (12.0 ttl pk-yrs)    Types: Cigarettes    Start date: 11/17/1974    Quit date: 11/17/1986    Years since quitting: 37.7  . Smokeless tobacco: Never  Vaping Use  . Vaping status: Never Used  Substance and Sexual Activity  . Alcohol use: Not Currently    Comment: very rare  . Drug use: Never  . Sexual activity: Defer   Social Drivers of Health   Financial Resource Strain: Unknown (01/15/2018)   Overall Financial Resource Strain (CARDIA)   . Difficulty of Paying Living Expenses: Patient declined  Food Insecurity: No Food Insecurity (04/11/2023)   Received from [facility]   Hunger Vital Sign   . Within the past 12 months, you worried that your food would run out before you got the money to buy more.: Never true   . Within the past 12 months, the food you bought just didn't last and you didn't have money to get more.:  Never true  Transportation Needs: No Transportation Needs (04/11/2023)   Received from [facility] - Transportation   . Lack of Transportation (Medical): No   . Lack of Transportation (Non-Medical): No  Physical Activity: Unknown (01/15/2018)   Exercise Vital Sign   . Days of Exercise per Week: Patient declined   . Minutes of Exercise per Session: Patient declined  Stress: Unknown (01/15/2018)   [employer] of Occupational Health - Occupational Stress Questionnaire   . Feeling of Stress : Patient declined  Social Connections: Unknown (01/15/2018)   Social Connection and Isolation Panel   . Frequency of Communication with Friends and Family: Patient declined   . Frequency of Social Gatherings with Friends and Family: Patient declined   . Attends Religious Services: Patient declined   . Active Member of Clubs or Organizations: Patient declined   . Attends Banker Meetings: Patient declined   . Marital Status: Patient declined  Housing  Stability: Unknown (01/16/2024)   Housing Stability Vital Sign   . Homeless in the Last Year: No     ASSESSMENT AND PLAN:  Vincent Sanchez is a very pleasant 63 y.o. male who presents for routine follow-up after undergoing SADI-S with Dr. Tomi on 04/12/21.  - S/p bariatric surgery: Patient was provided information on the signs and symptoms of common and benign, as well as problematic medical issues that patient is at higher risk for after surgery, including acid reflux, gas, gallstones, hair loss, malabsorptive diarrhea, and vitamin/mineral/protein deficiencies.   - Morbid obesity: Preop BMI 43-->current BMI 26. Expected versus actual weight loss discussed in detail. The current diet and exercise regimens were reviewed in clinic, including protein and average fluid intake.  Recommendations for continued safe weight loss, health and nutrition discussed. Encouraged to contact us  if patient undergoes weight gain more than normal daily fluctuations. The patient was encouraged to continue diversifying the diet and to maintain and expand the exercise program.   - Acid reflux: Patient does not report reflux today.    - Malabsorptive diarrhea: Patient is at higher risk for loose stools/more frequent bowel movements s/p SADI-S  He does not report issues with diarrhea today.  - Post-surgical malabsorption: Patient reminded that unless instructed on adjustments based on lab results, he should be taking a calcium + vitamin D supplement, B12, MV daily. Postoperative needs are greater than RYBG and sleeve and additional ADEK is required. Comprehensive set of labs will be done on the patient following today's visit and we will adjust supplementation as necessary pending results of the labwork.  Refills of folic acid  sent to pharmacy x12 months today.    - A return appointment will be scheduled in 12 months for his next post-operative visit.  Pt encouraged to call the clinic or send a MyChart message with  any questions or concerns. Pt demonstrates understanding, is amenable to the outlined plan, and there are no barriers to education today.    Attestation Statement:   I personally performed the service, non-incident to. (WP)   ANNE STEPHANO WARD, PA-C [facility]

## 2024-07-23 ENCOUNTER — Telehealth (HOSPITAL_COMMUNITY): Payer: Self-pay | Admitting: *Deleted

## 2024-07-23 NOTE — Telephone Encounter (Signed)
Reaching out to patient to offer assistance regarding upcoming cardiac imaging study; pt verbalizes understanding of appt date/time, parking situation and where to check in, pre-test NPO status  and verified current allergies; name and call back number provided for further questions should they arise ? ?Adalaya Irion RN Navigator Cardiac Imaging ?[facility] ?[phone] office ?[phone] cell ? ?

## 2024-07-26 ENCOUNTER — Ambulatory Visit
Admission: RE | Admit: 2024-07-26 | Discharge: 2024-07-26 | Disposition: A | Discharge status: Home / Self Care | Source: Ambulatory Visit | Attending: Internal Medicine | Admitting: Internal Medicine

## 2024-07-26 DIAGNOSIS — E785 Hyperlipidemia, unspecified: Secondary | ICD-10-CM | POA: Insufficient documentation

## 2024-07-26 DIAGNOSIS — I255 Ischemic cardiomyopathy: Secondary | ICD-10-CM | POA: Diagnosis present

## 2024-07-26 DIAGNOSIS — I5022 Chronic systolic (congestive) heart failure: Secondary | ICD-10-CM | POA: Insufficient documentation

## 2024-07-26 DIAGNOSIS — I480 Paroxysmal atrial fibrillation: Secondary | ICD-10-CM | POA: Insufficient documentation

## 2024-07-26 MED ORDER — METOPROLOL TARTRATE 5 MG/5ML IV SOLN: 10.0000 mg | INTRAVENOUS | Status: DC | PRN

## 2024-07-26 MED ORDER — IOHEXOL 350 MG/ML SOLN
100.0000 mL | Freq: Once | INTRAVENOUS | Status: AC | PRN
  Administered 2024-07-26: 100 mL via INTRAVENOUS

## 2024-07-26 MED ORDER — NITROGLYCERIN 0.4 MG SL SUBL
0.8000 mg | SUBLINGUAL_TABLET | Freq: Once | SUBLINGUAL | Status: AC
  Administered 2024-07-26: 0.8 mg via SUBLINGUAL
  Filled 2024-07-26: qty 25

## 2024-07-26 MED ORDER — DILTIAZEM HCL 25 MG/5ML IV SOLN: 10.0000 mg | INTRAVENOUS | Status: DC | PRN

## 2024-07-26 NOTE — Progress Notes (Signed)
 Patient tolerated procedure well. Ambulate w/o difficulty. Denies any lightheadedness or being dizzy. Pt denies any pain at this time. Sitting in chair. Pt is encouraged to drink additional water throughout the day and reason explained to patient. Patient verbalized understanding and all questions answered. ABC intact. No further needs at this time. Discharge from procedure area w/o issues.
